# Patient Record
Sex: Female | Born: 1960 | Race: Black or African American | Hispanic: No | Smoking: Former smoker
Health system: Southern US, Community
[De-identification: ages and names within clinical notes are randomized; demographics above are authoritative.]

## PROBLEM LIST (undated history)

## (undated) DIAGNOSIS — S134XXA Sprain of ligaments of cervical spine, initial encounter: Secondary | ICD-10-CM

## (undated) DIAGNOSIS — Z972 Presence of dental prosthetic device (complete) (partial): Secondary | ICD-10-CM

## (undated) DIAGNOSIS — E669 Obesity, unspecified: Secondary | ICD-10-CM

## (undated) HISTORY — PX: FOOT SURGERY: SHX648

## (undated) HISTORY — DX: Sprain of ligaments of cervical spine, initial encounter: S13.4XXA

## (undated) HISTORY — DX: Obesity, unspecified: E66.9

---

## 2008-12-06 ENCOUNTER — Emergency Department: Payer: Self-pay | Admitting: Emergency Medicine

## 2009-12-28 ENCOUNTER — Ambulatory Visit: Payer: Self-pay | Admitting: Family Medicine

## 2010-01-04 ENCOUNTER — Emergency Department: Payer: Self-pay | Admitting: Emergency Medicine

## 2011-03-22 ENCOUNTER — Ambulatory Visit: Payer: Self-pay | Admitting: General Practice

## 2011-03-30 ENCOUNTER — Encounter: Payer: Self-pay | Admitting: Physician Assistant

## 2011-04-12 ENCOUNTER — Encounter: Payer: Self-pay | Admitting: Physician Assistant

## 2013-09-09 LAB — HM PAP SMEAR

## 2013-09-12 ENCOUNTER — Ambulatory Visit: Payer: Self-pay | Admitting: Family Medicine

## 2013-12-09 ENCOUNTER — Ambulatory Visit: Payer: Self-pay | Admitting: Family Medicine

## 2014-09-03 ENCOUNTER — Ambulatory Visit: Payer: Self-pay | Admitting: Physician Assistant

## 2014-09-15 ENCOUNTER — Ambulatory Visit: Payer: BC Managed Care – PPO | Admitting: Podiatry

## 2014-09-26 ENCOUNTER — Encounter: Payer: Self-pay | Admitting: Podiatry

## 2014-09-26 ENCOUNTER — Other Ambulatory Visit: Payer: Self-pay | Admitting: *Deleted

## 2014-09-26 ENCOUNTER — Ambulatory Visit (INDEPENDENT_AMBULATORY_CARE_PROVIDER_SITE_OTHER): Payer: BC Managed Care – PPO

## 2014-09-26 ENCOUNTER — Ambulatory Visit (INDEPENDENT_AMBULATORY_CARE_PROVIDER_SITE_OTHER): Payer: BC Managed Care – PPO | Admitting: Podiatry

## 2014-09-26 VITALS — BP 125/86 | HR 75 | Resp 16 | Ht 62.0 in | Wt 232.6 lb

## 2014-09-26 DIAGNOSIS — M722 Plantar fascial fibromatosis: Secondary | ICD-10-CM

## 2014-09-26 MED ORDER — TRIAMCINOLONE ACETONIDE 10 MG/ML IJ SUSP
10.0000 mg | Freq: Once | INTRAMUSCULAR | Status: AC
  Administered 2014-09-26: 10 mg

## 2014-09-26 MED ORDER — DICLOFENAC SODIUM 75 MG PO TBEC: 75.0000 mg | DELAYED_RELEASE_TABLET | Freq: Two times a day (BID) | ORAL | Status: DC

## 2014-09-26 NOTE — Progress Notes (Signed)
Subjective:     Patient ID: Candice Campbell, female   DOB: 1961-05-25, 53 y.o.   MRN: [id number]  Foot Pain   patient presents stating I'm having a lot of heel pain for the last month and it seems to be getting worse and it's making it hard for me to do any form of walking   Review of Systems  All other systems reviewed and are negative.      Objective:   Physical Exam  Nursing note and vitals reviewed. Constitutional: She is oriented to person, place, and time.  Cardiovascular: Intact distal pulses.   Musculoskeletal: Normal range of motion.  Neurological: She is oriented to person, place, and time.  Skin: Skin is warm.   neurovascular status intact with muscle strength adequate and range of motion of the subtalar and midtarsal joint within normal limits. Patient is noted to have good digital perfusion is well oriented x3 with no equinus condition. Patient has extreme pain in the left plantar fashion at the insertion of the tendon into the calcaneus with no pain within the calcaneus itself     Assessment:     Plantar fasciitis of the left heel at the insertion    Plan:     H&P and x-rays reviewed with patient. Injected the left plantar fascia 3 mg Kenalog 5 mg Xylocaine and applied fascial strapping with instructions on usage and brace and diclofenac 75 mg twice a day. Instructed on physical therapy and reappoint in 10 days to recheck

## 2014-09-26 NOTE — Progress Notes (Signed)
   Subjective:    Patient ID: Candice Campbell, female    DOB: 1961-01-14, 53 y.o.   MRN: [id number]  HPI Comments: LEFT PLANTAR HEEL PAIN THAT HAS BEEN GOING ON FOR ABOUT A MONTH NOW, IT THROBS AND IT IS SHARP. SOMETIMES I CAN HARDLY WALK IN THE MORNINGS.   Foot Pain      Review of Systems  All other systems reviewed and are negative.      Objective:   Physical Exam        Assessment & Plan:

## 2014-09-26 NOTE — Patient Instructions (Signed)

## 2014-10-07 ENCOUNTER — Ambulatory Visit: Payer: BC Managed Care – PPO | Admitting: Podiatry

## 2014-10-17 ENCOUNTER — Ambulatory Visit: Payer: BC Managed Care – PPO | Admitting: Podiatry

## 2014-11-14 ENCOUNTER — Ambulatory Visit: Payer: BC Managed Care – PPO | Admitting: Podiatry

## 2014-12-19 ENCOUNTER — Ambulatory Visit (INDEPENDENT_AMBULATORY_CARE_PROVIDER_SITE_OTHER): Payer: BC Managed Care – PPO | Admitting: Podiatry

## 2014-12-19 DIAGNOSIS — M722 Plantar fascial fibromatosis: Secondary | ICD-10-CM

## 2014-12-21 NOTE — Progress Notes (Signed)
Subjective:     Patient ID: Candice Campbell, female   DOB: 03-02-1961, 54 y.o.   MRN: [id number]  HPI patient states I been getting severe pain in the plantar aspect of my left heel and it did not respond to the medication that we used several months ago and I have just been trying to gut it out. States that it's getting worse and it's making it possible for her to do activities and it's been present for about 8-10 months now   Review of Systems     Objective:   Physical Exam Neurovascular status intact with muscle strength adequate and range of motion subtalar midtarsal joint within normal limits. Severe discomfort plantar aspect left heel medial band and also mild discomfort in the lateral side of the heel and into the Achilles tendon where she is walking different. There is quite a bit of swelling present with the pain    Assessment:     Plantar fasciitis left with inflammation and fluid buildup around the medial band    Plan:     Reviewed condition and recommended physical therapy anti-inflammatories and long-term because the pain so severe correction. Patient wants to get this fixed and I explained to the patient endoscopic surgery and the advantages and risk associated with it. She wants surgery and I allowed her to read a consent form for correction going over all possible complications as outlined in the consent form and she is [facility] in the next several weeks. I dispensed air fracture walker with instructions on usage and encouraged her to call with any questions

## 2014-12-24 DIAGNOSIS — M722 Plantar fascial fibromatosis: Secondary | ICD-10-CM

## 2014-12-26 ENCOUNTER — Telehealth: Payer: Self-pay | Admitting: Podiatry

## 2014-12-26 NOTE — Telephone Encounter (Signed)
PT IS WANTING TO SEE ABOUT [facility] PAPERWORK HAS BEEN SENT OFF TO [facility]- SHE STATES ONE OF THE DISABILITY FORMS HAS ALREADY BEEN SENT BUT SHE IS CHECKING ON THE [facility]. PLEASE CALL PATIENT WITH INFO ON Monday PLEASE

## 2014-12-30 DIAGNOSIS — M722 Plantar fascial fibromatosis: Secondary | ICD-10-CM

## 2014-12-31 ENCOUNTER — Ambulatory Visit (INDEPENDENT_AMBULATORY_CARE_PROVIDER_SITE_OTHER): Payer: BC Managed Care – PPO | Admitting: Podiatry

## 2014-12-31 VITALS — BP 149/97 | HR 89 | Temp 98.0°F | Resp 16

## 2014-12-31 DIAGNOSIS — Z9889 Other specified postprocedural states: Secondary | ICD-10-CM

## 2014-12-31 MED ORDER — ONDANSETRON HCL 4 MG PO TABS: 4.0000 mg | ORAL_TABLET | Freq: Three times a day (TID) | ORAL | Status: DC | PRN

## 2014-12-31 NOTE — Progress Notes (Signed)
She presents today 1 week status post endoscopic plantar fasciotomy of the left foot. She denies fever chills nausea vomiting muscle aches and pains. She states that she is tired of the boot. She states that she has been wearing all of the time.  Objective: Cam Walker and dry sterile dressing is intact left foot. Once removed demonstrates no edema no erythema no cellulitis no drainage no odor. Sutures are intact and margins appear to be well coapted mild tenderness on palpation of the plantar fascial calcaneal insertion site.  Assessment: Well-healed surgical endoscopic plantar fasciotomy left foot 1 week.  Plan: I placed her in a compression anklet today and apply Band-Aids to the surgical sites. I encouraged massage therapy to the plantar aspect of the left heel to help break down any scar tissue that will be forming over the next few weeks. She will follow-up with Dr. Charlsie Merlesegal in 1 week. She was instructed not to get this foot wet until she sees Dr. Charlsie Merlesegal.

## 2015-01-06 ENCOUNTER — Ambulatory Visit (INDEPENDENT_AMBULATORY_CARE_PROVIDER_SITE_OTHER): Payer: BC Managed Care – PPO | Admitting: Podiatry

## 2015-01-06 VITALS — BP 169/106 | HR 85 | Resp 16

## 2015-01-06 DIAGNOSIS — M722 Plantar fascial fibromatosis: Secondary | ICD-10-CM

## 2015-01-06 NOTE — Progress Notes (Signed)
Subjective:     Patient ID: Candice Campbell, female   DOB: 05-22-1961, 54 y.o.   MRN: [id number]  HPI patient presents stating she is doing well but she still has pain in the morning and after periods of sitting and she has trouble wearing the big boot to sleep and and she gets some pain if she does walk all day   Review of Systems     Objective:   Physical Exam Neurovascular status intact negative Homan sign was noted with wound sites on the medial and lateral heel that are doing well with the edges well coapted and stitches intact. Minimal plantar pain noted    Assessment:     Doing well post foot surgery left heel    Plan:     Advised on physical therapy and dispensed night splint to wear at night to keep the foot stretched and surgical shoe to begin slowly wearing as a option to the boot. Stitches removed and sterile dressings applied with wound edges well coapted and reappoint again in 4-6 weeks or earlier if any issues should occur

## 2015-01-13 ENCOUNTER — Encounter: Payer: Self-pay | Admitting: Podiatry

## 2015-01-15 ENCOUNTER — Encounter: Payer: Self-pay | Admitting: Podiatry

## 2015-02-20 ENCOUNTER — Ambulatory Visit (INDEPENDENT_AMBULATORY_CARE_PROVIDER_SITE_OTHER): Payer: BC Managed Care – PPO | Admitting: Podiatry

## 2015-02-20 ENCOUNTER — Encounter: Payer: Self-pay | Admitting: Podiatry

## 2015-02-20 VITALS — BP 157/92 | HR 77

## 2015-02-20 DIAGNOSIS — M722 Plantar fascial fibromatosis: Secondary | ICD-10-CM

## 2015-02-20 MED ORDER — TRIAMCINOLONE ACETONIDE 10 MG/ML IJ SUSP
10.0000 mg | Freq: Once | INTRAMUSCULAR | Status: AC
  Administered 2015-02-20: 10 mg

## 2015-02-20 NOTE — Progress Notes (Signed)
Subjective:     Patient ID: Candice Campbell, female   DOB: [birth year], 54 y.o.   MRN: [id number]  HPI patient states I'm still having pain in the bottom my left heel but it seems to be in a different spot than wear was previously   Review of Systems     Objective:   Physical Exam Neurovascular status intact with incision sites on the medial and lateral side of the left heel that are doing well and wound edges are coapted well. Pain is more on the proximal portion of the fascia and more central and lateral    Assessment:     Patient does have continued edema and may be developing stress on the remaining band of the plantar fascia with the medial band doing very well with no pain when palpated    Plan:     Injected from the lateral side 3 mg Kenalog 5 mg Xylocaine advised on physical therapy and reappoint to recheck again in several weeks

## 2015-03-20 ENCOUNTER — Encounter: Payer: Self-pay | Admitting: Podiatry

## 2015-03-20 ENCOUNTER — Ambulatory Visit (INDEPENDENT_AMBULATORY_CARE_PROVIDER_SITE_OTHER): Payer: BC Managed Care – PPO | Admitting: Podiatry

## 2015-03-20 VITALS — BP 145/82 | HR 66 | Resp 16

## 2015-03-20 DIAGNOSIS — Z9889 Other specified postprocedural states: Secondary | ICD-10-CM | POA: Diagnosis not present

## 2015-03-20 DIAGNOSIS — M779 Enthesopathy, unspecified: Secondary | ICD-10-CM

## 2015-03-20 DIAGNOSIS — M722 Plantar fascial fibromatosis: Secondary | ICD-10-CM | POA: Diagnosis not present

## 2015-03-20 MED ORDER — TRAMADOL HCL 50 MG PO TABS: 50.0000 mg | ORAL_TABLET | Freq: Three times a day (TID) | ORAL | Status: DC

## 2015-03-22 NOTE — Progress Notes (Signed)
Subjective:     Patient ID: Candice Campbell, female   DOB: 07-Aug-1961, 54 y.o.   MRN: [id number]  HPI patient states I'm still getting pain in my foot after surgery of 10 weeks ago. States that the plantar heel is doing well but there's pain in the mid arch lateral side of the foot when she's active   Review of Systems     Objective:   Physical Exam Neurovascular status intact with no changes in health history and no discomfort on the medial fascial band were released the tendon. I noted some discomfort on the lateral fascial band and more into the arch and lateral foot probable secondary to compensation. No mottled skin appearance no clonus to the foot and no indications of other systemic condition    Assessment:     Recovery still occurring from plantar fascial release left with lateral foot pain and midfoot pain    Plan:     I reviewed with her that this can occur after endoscopic surgery and it is still relatively early during the recovery process. She will increase her boot and night splint usage along with ice and we dispensed another fascial brace for support and also I placed her on tramadol 3 times a day. I will see her back in 5 weeks and if symptoms are not improving look at other testing but at this time it is still relatively early in the process and I'm very hopeful that this will get better on its own

## 2015-04-24 ENCOUNTER — Ambulatory Visit (INDEPENDENT_AMBULATORY_CARE_PROVIDER_SITE_OTHER): Payer: BC Managed Care – PPO | Admitting: Podiatry

## 2015-04-24 VITALS — BP 152/94 | HR 76 | Resp 16

## 2015-04-24 DIAGNOSIS — M722 Plantar fascial fibromatosis: Secondary | ICD-10-CM

## 2015-04-26 NOTE — Progress Notes (Signed)
Subjective:     Patient ID: Candice Campbell, female   DOB: 1961-03-20, 54 y.o.   MRN: [id number]  HPI patient states it hurts if him on on it too long but it's much better than it was prior to surgery   Review of Systems     Objective:   Physical Exam Neurovascular status intact wound edges well coapted with minimal discomfort plantar heel left and mild discomfort in the arch left foot    Assessment:     Doing well post endoscopic surgery left    Plan:     H&P and condition reviewed and at this point I recommended supportive shoe gear usage anti-inflammatories and gradual increase in activities along with splint usage at night. I do believe symptoms will completely go away but may take several more months

## 2015-07-15 ENCOUNTER — Ambulatory Visit: Payer: Self-pay | Admitting: Family Medicine

## 2015-10-16 ENCOUNTER — Telehealth: Payer: Self-pay | Admitting: Family Medicine

## 2015-10-16 ENCOUNTER — Other Ambulatory Visit: Payer: Self-pay | Admitting: Family Medicine

## 2015-10-16 NOTE — Telephone Encounter (Signed)
Pt needs refills on lisinopril 10 mg and hydrochlorothiazide 25 mg sent to [facility] on [address]  Her call back number is [phone]

## 2015-10-16 NOTE — Telephone Encounter (Signed)
Explained to patient she must be seen and maybe she can get #30, but she will need to keep appt. Patient has appt scheduled for 10/20/15.

## 2015-10-16 NOTE — Telephone Encounter (Signed)
May send in refills for #30 of Lisinopril 10 mg., and HCTZ, 25 mg to her pharmacy

## 2015-10-20 ENCOUNTER — Ambulatory Visit (INDEPENDENT_AMBULATORY_CARE_PROVIDER_SITE_OTHER): Payer: BC Managed Care – PPO | Admitting: Family Medicine

## 2015-10-20 ENCOUNTER — Encounter: Payer: Self-pay | Admitting: Family Medicine

## 2015-10-20 VITALS — BP 125/80 | HR 86 | Temp 97.5°F | Resp 16 | Ht 65.0 in | Wt 228.0 lb

## 2015-10-20 DIAGNOSIS — I10 Essential (primary) hypertension: Secondary | ICD-10-CM | POA: Insufficient documentation

## 2015-10-20 DIAGNOSIS — E669 Obesity, unspecified: Secondary | ICD-10-CM | POA: Diagnosis not present

## 2015-10-20 HISTORY — DX: Obesity, unspecified: E66.9

## 2015-10-20 MED ORDER — LOSARTAN POTASSIUM 25 MG PO TABS: 25.0000 mg | ORAL_TABLET | Freq: Every day | ORAL | Status: DC

## 2015-10-20 MED ORDER — CLONIDINE HCL 0.1 MG PO TABS: 0.1000 mg | ORAL_TABLET | Freq: Two times a day (BID) | ORAL | Status: DC

## 2015-10-20 MED ORDER — HYDROCHLOROTHIAZIDE 25 MG PO TABS: 25.0000 mg | ORAL_TABLET | Freq: Every day | ORAL | Status: DC

## 2015-10-20 NOTE — Progress Notes (Signed)
Name: Candice Campbell   MRN: [id number]    DOB: 1961-03-23   Date:10/20/2015       Progress Note  Subjective  Chief Complaint  Chief Complaint  Patient presents with  . Hypertension    HPI  Here for f/u of HBP.  Taking her meds.  Feeling pretty well overall, but still gaining weight. No problem-specific assessment & plan notes found for this encounter.   Past Medical History  Diagnosis Date  . Hypertension     Social History  Substance Use Topics  . Smoking status: Current Some Day Smoker -- 0.50 packs/day for 10 years    Types: Cigarettes  . Smokeless tobacco: Never Used  . Alcohol Use: 0.0 oz/week    0 Standard drinks or equivalent per week     Comment: occasional     Current outpatient prescriptions:  .  cloNIDine (CATAPRES) 0.1 MG tablet, , Disp: , Rfl:  .  hydrochlorothiazide (HYDRODIURIL) 25 MG tablet, TAKE ONE TABLET BY MOUTH ONCE DAILY, Disp: 30 tablet, Rfl: 0 .  losartan (COZAAR) 25 MG tablet, Take 25 mg by mouth daily., Disp: , Rfl:  .  diclofenac (VOLTAREN) 75 MG EC tablet, Take 1 tablet (75 mg total) by mouth 2 (two) times daily. (Patient not taking: Reported on 10/20/2015), Disp: 60 tablet, Rfl: 1  No Known Allergies  Review of Systems  Constitutional: Negative for fever, chills, weight loss and malaise/fatigue.  HENT: Negative for hearing loss.   Eyes: Negative for blurred vision and double vision.  Respiratory: Negative for cough, sputum production, shortness of breath and wheezing.   Cardiovascular: Negative for chest pain, palpitations and leg swelling.  Gastrointestinal: Negative for heartburn, abdominal pain and blood in stool.  Genitourinary: Negative for dysuria, urgency and frequency.  Musculoskeletal: Negative for myalgias and joint pain.  Skin: Negative for rash.  Neurological: Negative for dizziness, tremors, weakness and headaches.      Objective  Filed Vitals:   10/20/15 0830  BP: 129/86  Pulse: 86  Temp: 97.5 F (36.4 C)  TempSrc:  Oral  Resp: 16  Height:  (1.651 m)  Weight: 228 lb (103.42 kg)     Physical Exam  Constitutional: She is oriented to person, place, and time and well-developed, well-nourished, and in no distress. No distress.  HENT:  Head: Normocephalic and atraumatic.  Eyes: Conjunctivae are normal. Pupils are equal, round, and reactive to light. No scleral icterus.  Neck: Normal range of motion. Neck supple. Carotid bruit is not present. No thyromegaly present.  Cardiovascular: Normal rate, regular rhythm, normal heart sounds and intact distal pulses.  Exam reveals no gallop and no friction rub.   No murmur heard. Pulmonary/Chest: Effort normal and breath sounds normal. No respiratory distress. She has no wheezes. She has no rales.  Abdominal: Soft. Bowel sounds are normal. She exhibits no distension, no abdominal bruit and no mass. There is no tenderness.  Musculoskeletal: Normal range of motion. She exhibits no edema.  Lymphadenopathy:    She has no cervical adenopathy.  Neurological: She is alert and oriented to person, place, and time.  Vitals reviewed.     No results found for this or any previous visit (from the past 2160 hour(s)).   Assessment & Plan  1. Essential hypertension  - Comprehensive Metabolic Panel (CMET) - CBC with Differential - losartan (COZAAR) 25 MG tablet; Take 1 tablet (25 mg total) by mouth daily.  Dispense: 30 tablet; Refill: 12 - hydrochlorothiazide (HYDRODIURIL) 25 MG tablet; Take 1 tablet (  25 mg total) by mouth daily.  Dispense: 30 tablet; Refill: 12 - cloNIDine (CATAPRES) 0.1 MG tablet; Take 1 tablet (0.1 mg total) by mouth 2 (two) times daily.  Dispense: 60 tablet; Refill: 12  2. Obesity  - Lipid Profile - TSH

## 2015-10-20 NOTE — Patient Instructions (Signed)
Discussed reducing calories to reduce weight.  Discussed stopping smoking again.

## 2015-10-27 ENCOUNTER — Telehealth: Payer: Self-pay | Admitting: Family Medicine

## 2015-10-27 NOTE — Telephone Encounter (Signed)
Will be printed from [email or web address]

## 2015-10-27 NOTE — Telephone Encounter (Signed)
Pt needs a copy of her shot record.  Her call back number is [phone].

## 2015-10-28 ENCOUNTER — Ambulatory Visit: Payer: BC Managed Care – PPO

## 2015-11-10 ENCOUNTER — Ambulatory Visit (INDEPENDENT_AMBULATORY_CARE_PROVIDER_SITE_OTHER): Payer: BC Managed Care – PPO | Admitting: *Deleted

## 2015-11-10 DIAGNOSIS — Z23 Encounter for immunization: Secondary | ICD-10-CM

## 2015-11-10 NOTE — Progress Notes (Signed)
Patient was given TDAP and Hep B #1 today. Patient tolerated well.

## 2016-02-01 ENCOUNTER — Ambulatory Visit: Payer: Self-pay | Admitting: Family Medicine

## 2016-02-14 IMAGING — CR DG HEEL 2V*L*
1 series · 2 of 2 positions shown · non-contrast
Comparison: None.

CLINICAL DATA: Heel pain when standing or walking.

EXAM:
DG HEEL 2V LEFT

[Series 1: lat · 0.17mm/px · 2 of 2 slices shown]
[im 1/2]
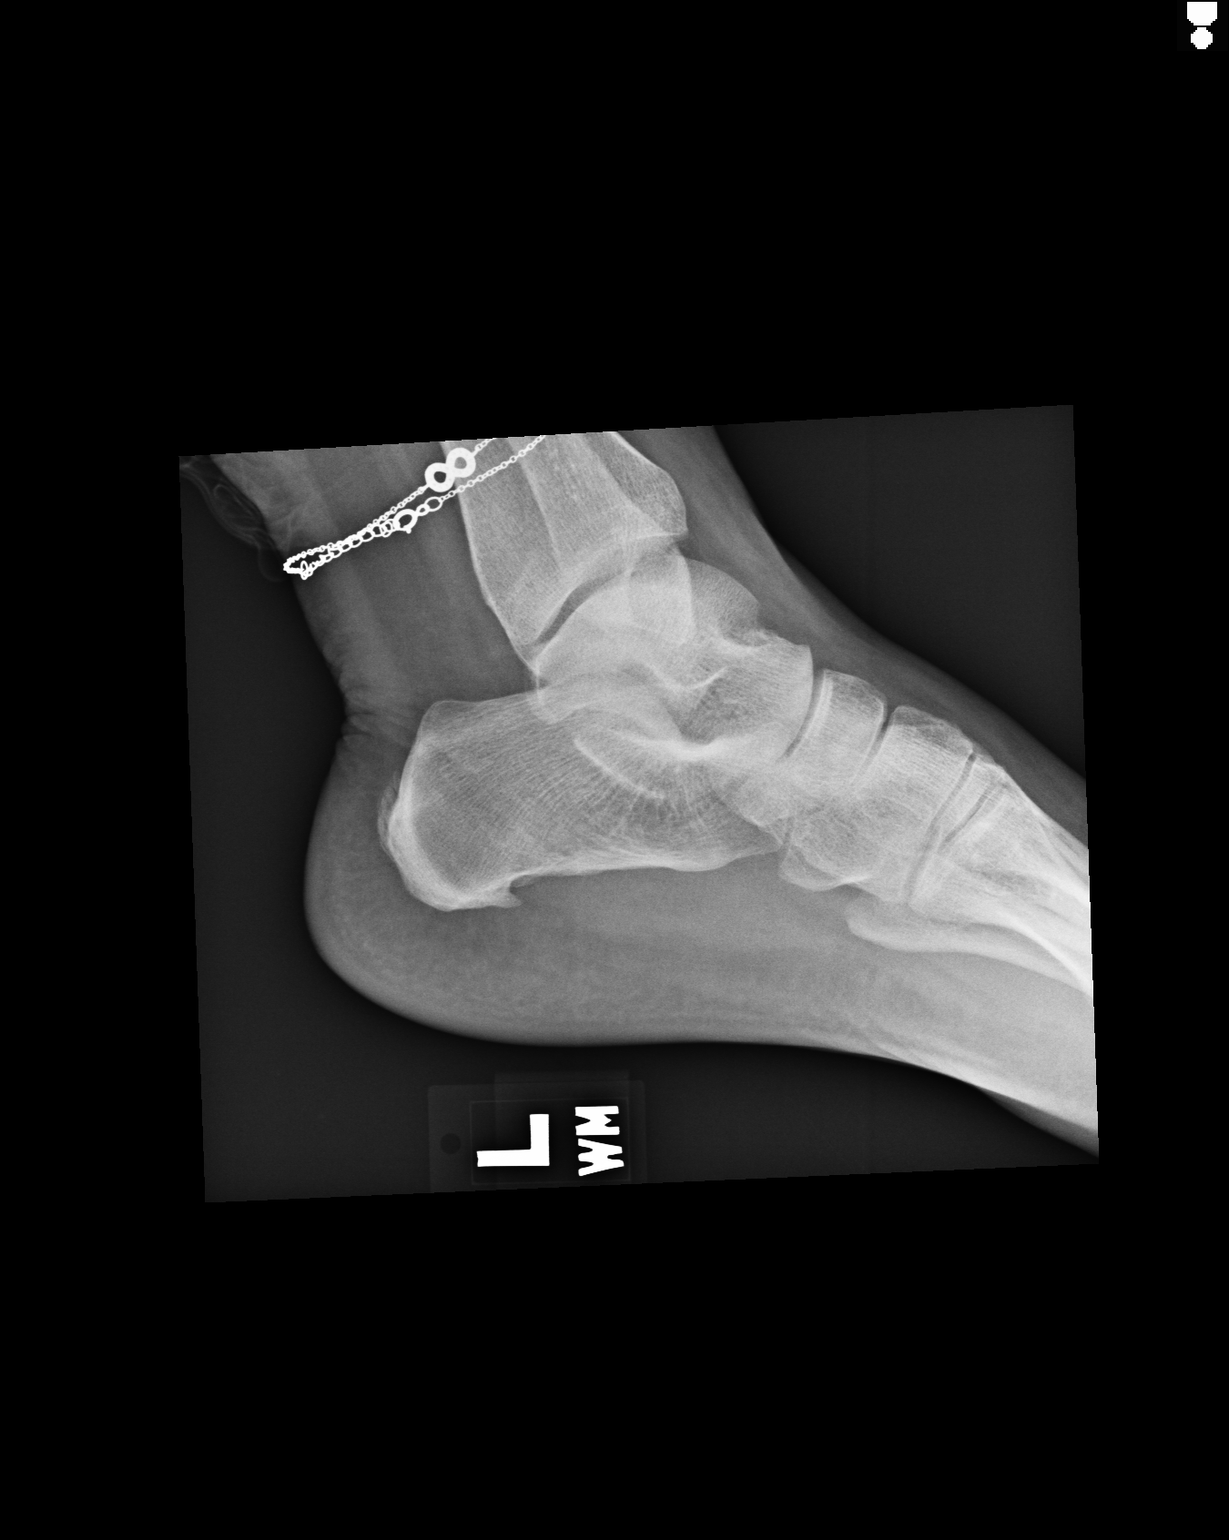
[im 2/2]
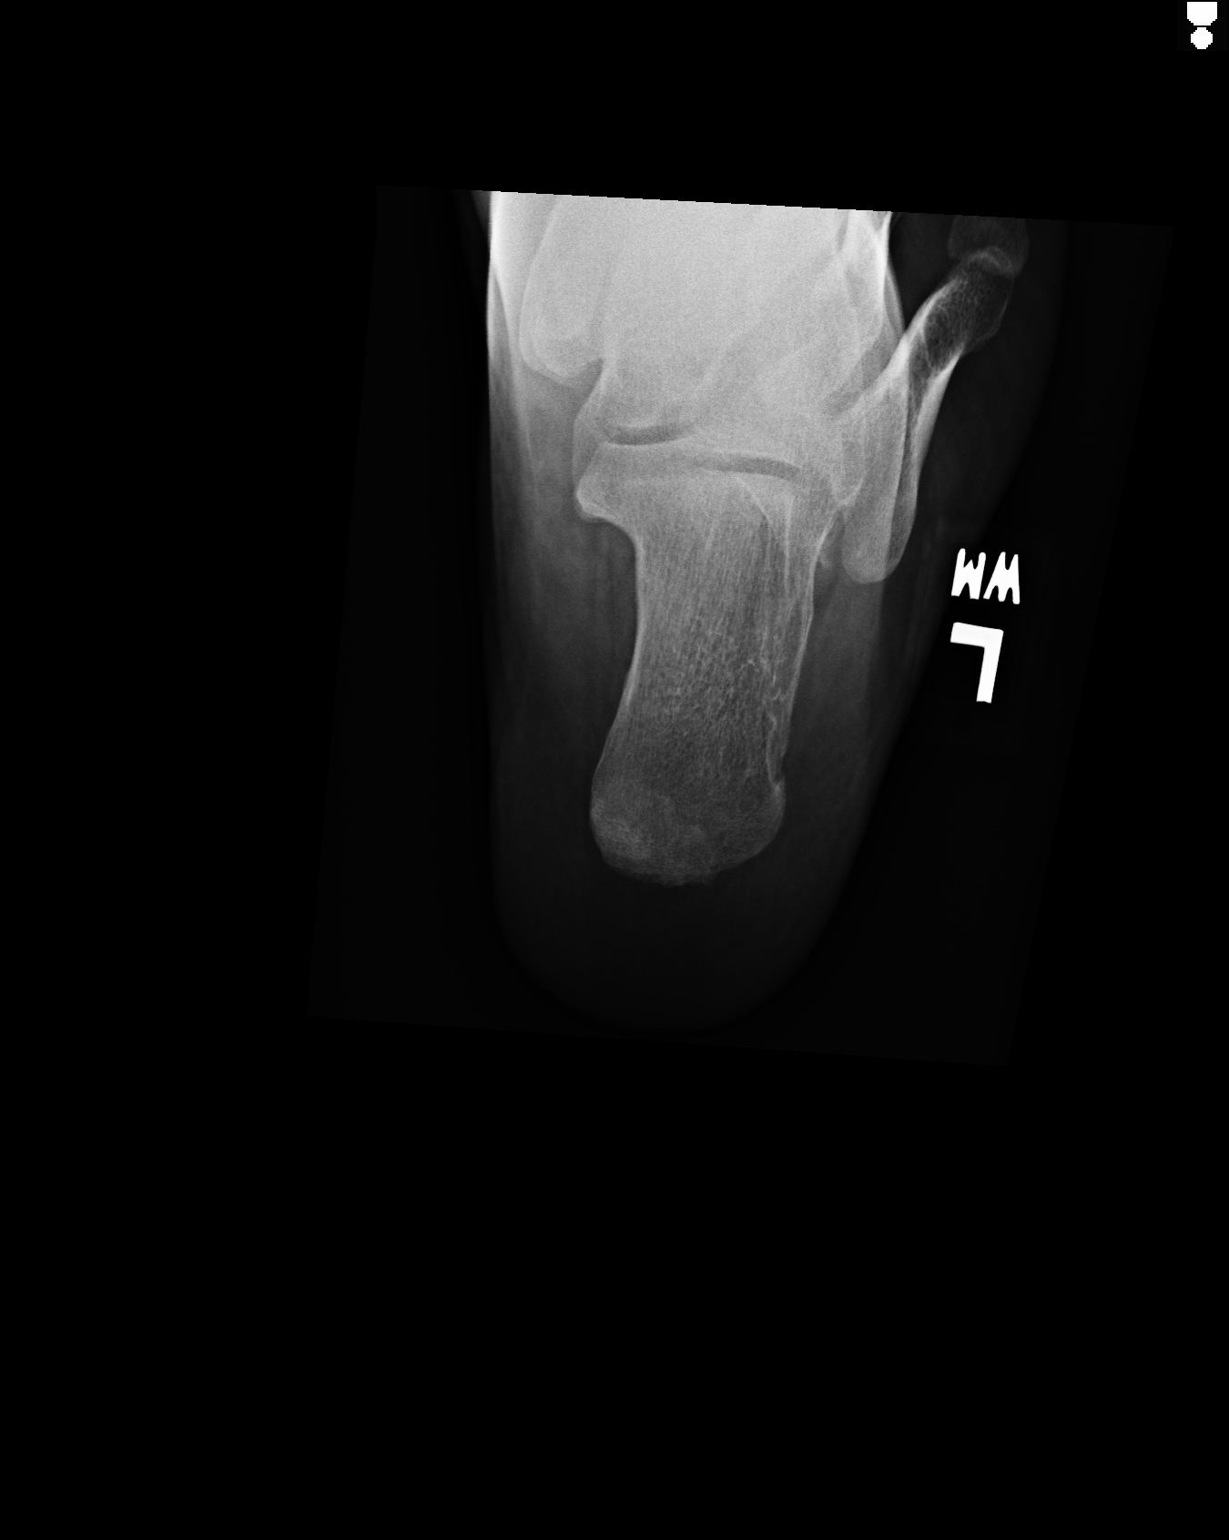

[2 of 2 positions shown; findings below may reference images not displayed]

FINDINGS: There is a small plantar calcaneal spur. There is slight
calcification at the Achilles insertion on the posterior calcaneus.
Otherwise normal.
IMPRESSION: Calcaneal spurs.

## 2016-03-09 ENCOUNTER — Ambulatory Visit (INDEPENDENT_AMBULATORY_CARE_PROVIDER_SITE_OTHER): Payer: BC Managed Care – PPO | Admitting: Family Medicine

## 2016-03-09 ENCOUNTER — Ambulatory Visit
Admission: RE | Admit: 2016-03-09 | Discharge: 2016-03-09 | Disposition: A | Discharge status: Home / Self Care | Payer: BC Managed Care – PPO | Source: Ambulatory Visit | Attending: Family Medicine | Admitting: Family Medicine

## 2016-03-09 ENCOUNTER — Encounter: Payer: Self-pay | Admitting: Family Medicine

## 2016-03-09 VITALS — BP 148/97 | HR 94 | Temp 100.4°F | Resp 16 | Ht 65.0 in | Wt 223.6 lb

## 2016-03-09 DIAGNOSIS — J101 Influenza due to other identified influenza virus with other respiratory manifestations: Secondary | ICD-10-CM | POA: Insufficient documentation

## 2016-03-09 DIAGNOSIS — I10 Essential (primary) hypertension: Secondary | ICD-10-CM

## 2016-03-09 DIAGNOSIS — J40 Bronchitis, not specified as acute or chronic: Secondary | ICD-10-CM | POA: Insufficient documentation

## 2016-03-09 LAB — POCT INFLUENZA A/B
Influenza A, POC: POSITIVE — AB
Influenza B, POC: NEGATIVE

## 2016-03-09 MED ORDER — BENZONATATE 100 MG PO CAPS: 100.0000 mg | ORAL_CAPSULE | Freq: Three times a day (TID) | ORAL | Status: DC | PRN

## 2016-03-09 MED ORDER — ALBUTEROL SULFATE HFA 108 (90 BASE) MCG/ACT IN AERS: 2.0000 | INHALATION_SPRAY | Freq: Four times a day (QID) | RESPIRATORY_TRACT | Status: DC | PRN

## 2016-03-09 MED ORDER — OSELTAMIVIR PHOSPHATE 75 MG PO CAPS: 75.0000 mg | ORAL_CAPSULE | Freq: Two times a day (BID) | ORAL | Status: DC

## 2016-03-09 MED ORDER — DM-GUAIFENESIN ER 30-600 MG PO TB12: 1.0000 | ORAL_TABLET | Freq: Two times a day (BID) | ORAL | Status: DC

## 2016-03-09 NOTE — Patient Instructions (Signed)

## 2016-03-09 NOTE — Assessment & Plan Note (Signed)
Pt cautioned to avoid OTC decongestant with HTN.

## 2016-03-09 NOTE — Progress Notes (Signed)
Subjective:    Patient ID: Candice Campbell, female    DOB: February 01, 1961, 55 y.o.   MRN: [id number]  HPI: Candice Campbell is a 55 y.o. female presenting on 03/09/2016 for Cough   HPI  Pt presents for cough and fever. Symptoms began on Friday with dry cough. Fever started on yesterday. T-max 100.4 orally. Dry cough continued through weekend. Cough worsened Sunday- previously coughing up yellow sputum. No chest tightness or shortness of breath. Some wheezing. Runny nose. HA and body aches began on Sunday. Home treatment: alka-seltazer cold and flu. Mild relief.  Some nausea.   Past Medical History  Diagnosis Date  . Hypertension     Current Outpatient Prescriptions on File Prior to Visit  Medication Sig  . cloNIDine (CATAPRES) 0.1 MG tablet Take 1 tablet (0.1 mg total) by mouth 2 (two) times daily.  . diclofenac (VOLTAREN) 75 MG EC tablet Take 1 tablet (75 mg total) by mouth 2 (two) times daily.  . hydrochlorothiazide (HYDRODIURIL) 25 MG tablet Take 1 tablet (25 mg total) by mouth daily.  Marland Kitchen losartan (COZAAR) 25 MG tablet Take 1 tablet (25 mg total) by mouth daily.   No current facility-administered medications on file prior to visit.    Review of Systems  Constitutional: Positive for fever, chills and fatigue.  HENT: Positive for congestion and rhinorrhea. Negative for ear pain and sore throat.   Eyes: Negative for visual disturbance.  Respiratory: Positive for cough and wheezing. Negative for chest tightness.   Cardiovascular: Negative for chest pain, palpitations and leg swelling.  Gastrointestinal: Positive for nausea. Negative for vomiting and diarrhea.  Musculoskeletal: Positive for myalgias. Negative for neck pain and neck stiffness.  Skin: Negative for color change and rash.  Neurological: Positive for headaches. Negative for syncope and weakness.   Per HPI unless specifically indicated above     Objective:    BP 148/97 mmHg  Pulse 94  Temp(Src) 100.4 F (38 C) (Oral)   Resp 16  Ht  (1.651 m)  Wt 223 lb 9.6 oz (101.424 kg)  BMI 37.21 kg/m2  SpO2 99%  Wt Readings from Last 3 Encounters:  03/09/16 223 lb 9.6 oz (101.424 kg)  10/20/15 228 lb (103.42 kg)  09/26/14 232 lb 9.6 oz (105.507 kg)    Physical Exam  Constitutional: She is oriented to person, place, and time. She appears well-developed and well-nourished. She appears ill. No distress.  HENT:  Head: Normocephalic and atraumatic.  Right Ear: Hearing and tympanic membrane normal.  Left Ear: Hearing and tympanic membrane normal.  Nose: Mucosal edema and rhinorrhea present. Right sinus exhibits no maxillary sinus tenderness and no frontal sinus tenderness. Left sinus exhibits no maxillary sinus tenderness and no frontal sinus tenderness.  Mouth/Throat: Uvula is midline, oropharynx is clear and moist and mucous membranes are normal. No posterior oropharyngeal erythema.  Neck: Normal range of motion. No erythema present. No Brudzinski's sign and no Kernig's sign noted.  Cardiovascular: Normal rate and regular rhythm.  Exam reveals no gallop and no friction rub.   No murmur heard. Pulmonary/Chest: Effort normal. No respiratory distress. She has no decreased breath sounds. She has wheezes (expiratory- with coughing.) in the right lower field and the left lower field. She has no rhonchi. She has no rales. Chest wall is not dull to percussion. She exhibits no tenderness.  Bronchial breath sounds.  Lymphadenopathy:    She has cervical adenopathy.       Right cervical: Superficial cervical adenopathy present.  Left cervical: Superficial cervical adenopathy present.  Neurological: She is alert and oriented to person, place, and time.  Skin: Skin is warm and dry. No rash noted. She is not diaphoretic. No erythema. No pallor.  Psychiatric: She has a normal mood and affect. Her speech is normal and behavior is normal.   Results for orders placed or performed in visit on 03/09/16  POCT Influenza A/B    Result Value Ref Range   Influenza A, POC Positive (A) Negative   Influenza B, POC Negative Negative      Assessment & Plan:   Problem List Items Addressed This Visit      Cardiovascular and Mediastinum   HBP (high blood pressure)    Pt cautioned to avoid OTC decongestant with HTN.        Other Visit Diagnoses    Influenza A    -  Primary    + Flu A. Questionable efficacy of tamiflu but patient would like to try. Supportive care at home for cough and Alarm symptoms and return precautions reviewed.     Relevant Medications    benzonatate (TESSALON) 100 MG capsule    dextromethorphan-guaiFENesin (MUCINEX DM) 30-600 MG 12hr tablet    oseltamivir (TAMIFLU) 75 MG capsule    Other Relevant Orders    POCT Influenza A/B (Completed)    DG Chest 2 View    Bronchitis        Likely 2/2 flu. Mild wheezing- no steroids needed. CXR to r/o pneumonia- plan to treat for atypicals if +pneumonia. Albuterol PRN. Respiratory distress symptoms reviewed- pt will go to ER if needed. Return precautons reviewed.    Relevant Medications    albuterol (PROVENTIL HFA;VENTOLIN HFA) 108 (90 Base) MCG/ACT inhaler    benzonatate (TESSALON) 100 MG capsule    Other Relevant Orders    DG Chest 2 View       Meds ordered this encounter  Medications  . albuterol (PROVENTIL HFA;VENTOLIN HFA) 108 (90 Base) MCG/ACT inhaler    Sig: Inhale 2 puffs into the lungs every 6 (six) hours as needed for wheezing or shortness of breath.    Dispense:  1 Inhaler    Refill:  0    Order Specific Question:  Supervising Provider    Answer:  Janeann ForehandHAWKINS JR, JAMES H [[id number]]  . benzonatate (TESSALON) 100 MG capsule    Sig: Take 1 capsule (100 mg total) by mouth 3 (three) times daily as needed.    Dispense:  30 capsule    Refill:  0    Order Specific Question:  Supervising Provider    Answer:  Janeann ForehandHAWKINS JR, JAMES H [[id number]]  . dextromethorphan-guaiFENesin (MUCINEX DM) 30-600 MG 12hr tablet    Sig: Take 1 tablet by mouth 2 (two) times  daily.    Dispense:  20 tablet    Refill:  0    Order Specific Question:  Supervising Provider    Answer:  Janeann ForehandHAWKINS JR, JAMES H [id number]]  . oseltamivir (TAMIFLU) 75 MG capsule    Sig: Take 1 capsule (75 mg total) by mouth 2 (two) times daily.    Dispense:  10 capsule    Refill:  0    Order Specific Question:  Supervising Provider    Answer:  Janeann ForehandHAWKINS JR, JAMES H [[id number]]      Follow up plan: Return if symptoms worsen or fail to improve.

## 2016-12-08 ENCOUNTER — Other Ambulatory Visit: Payer: Self-pay | Admitting: Family Medicine

## 2016-12-08 DIAGNOSIS — I10 Essential (primary) hypertension: Secondary | ICD-10-CM

## 2016-12-13 ENCOUNTER — Ambulatory Visit: Payer: BC Managed Care – PPO | Admitting: Family Medicine

## 2016-12-19 ENCOUNTER — Ambulatory Visit: Payer: BC Managed Care – PPO | Admitting: Family Medicine

## 2017-01-06 ENCOUNTER — Emergency Department: Payer: BC Managed Care – PPO

## 2017-01-06 ENCOUNTER — Emergency Department
Admission: EM | Admit: 2017-01-06 | Discharge: 2017-01-06 | Disposition: A | Discharge status: Home / Self Care | Payer: BC Managed Care – PPO | Attending: Student in an Organized Health Care Education/Training Program | Admitting: Student in an Organized Health Care Education/Training Program

## 2017-01-06 DIAGNOSIS — Y92410 Unspecified street and highway as the place of occurrence of the external cause: Secondary | ICD-10-CM | POA: Diagnosis not present

## 2017-01-06 DIAGNOSIS — F17210 Nicotine dependence, cigarettes, uncomplicated: Secondary | ICD-10-CM | POA: Insufficient documentation

## 2017-01-06 DIAGNOSIS — Y999 Unspecified external cause status: Secondary | ICD-10-CM | POA: Diagnosis not present

## 2017-01-06 DIAGNOSIS — Z79899 Other long term (current) drug therapy: Secondary | ICD-10-CM | POA: Insufficient documentation

## 2017-01-06 DIAGNOSIS — M7918 Myalgia, other site: Secondary | ICD-10-CM

## 2017-01-06 DIAGNOSIS — S161XXA Strain of muscle, fascia and tendon at neck level, initial encounter: Secondary | ICD-10-CM

## 2017-01-06 DIAGNOSIS — Y9389 Activity, other specified: Secondary | ICD-10-CM | POA: Insufficient documentation

## 2017-01-06 DIAGNOSIS — I10 Essential (primary) hypertension: Secondary | ICD-10-CM | POA: Diagnosis not present

## 2017-01-06 DIAGNOSIS — S199XXA Unspecified injury of neck, initial encounter: Secondary | ICD-10-CM | POA: Diagnosis present

## 2017-01-06 MED ORDER — TRAMADOL HCL 50 MG PO TABS: 50.0000 mg | ORAL_TABLET | Freq: Four times a day (QID) | ORAL | 0 refills | Status: DC | PRN

## 2017-01-06 MED ORDER — METHOCARBAMOL 750 MG PO TABS: 750.0000 mg | ORAL_TABLET | Freq: Four times a day (QID) | ORAL | 0 refills | Status: DC

## 2017-01-06 MED ORDER — IBUPROFEN 600 MG PO TABS: 600.0000 mg | ORAL_TABLET | Freq: Three times a day (TID) | ORAL | 0 refills | Status: DC | PRN
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
## 2017-01-09 ENCOUNTER — Encounter: Payer: Self-pay | Admitting: Family Medicine

## 2017-01-09 ENCOUNTER — Ambulatory Visit (INDEPENDENT_AMBULATORY_CARE_PROVIDER_SITE_OTHER): Payer: BC Managed Care – PPO | Admitting: Family Medicine

## 2017-01-09 VITALS — BP 126/74 | HR 83 | Temp 98.2°F | Resp 16 | Ht 65.0 in | Wt 220.0 lb

## 2017-01-09 DIAGNOSIS — S46912A Strain of unspecified muscle, fascia and tendon at shoulder and upper arm level, left arm, initial encounter: Secondary | ICD-10-CM

## 2017-01-09 DIAGNOSIS — S134XXA Sprain of ligaments of cervical spine, initial encounter: Secondary | ICD-10-CM

## 2017-01-09 HISTORY — DX: Sprain of ligaments of cervical spine, initial encounter: S13.4XXA

## 2017-01-09 MED ORDER — BACLOFEN 10 MG PO TABS: 5.0000 mg | ORAL_TABLET | Freq: Three times a day (TID) | ORAL | 0 refills | Status: DC | PRN
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
## 2017-01-20 ENCOUNTER — Ambulatory Visit: Payer: BC Managed Care – PPO | Admitting: Family Medicine

## 2017-01-27 ENCOUNTER — Ambulatory Visit (INDEPENDENT_AMBULATORY_CARE_PROVIDER_SITE_OTHER): Payer: BC Managed Care – PPO | Admitting: Family Medicine

## 2017-01-27 ENCOUNTER — Encounter: Payer: Self-pay | Admitting: Family Medicine

## 2017-01-27 ENCOUNTER — Ambulatory Visit: Payer: BC Managed Care – PPO | Admitting: Family Medicine

## 2017-01-27 VITALS — BP 158/94 | HR 78 | Temp 97.9°F | Resp 16 | Ht 65.0 in | Wt 223.0 lb

## 2017-01-27 DIAGNOSIS — S46912D Strain of unspecified muscle, fascia and tendon at shoulder and upper arm level, left arm, subsequent encounter: Secondary | ICD-10-CM | POA: Diagnosis not present

## 2017-01-27 DIAGNOSIS — I10 Essential (primary) hypertension: Secondary | ICD-10-CM

## 2017-01-27 DIAGNOSIS — S134XXD Sprain of ligaments of cervical spine, subsequent encounter: Secondary | ICD-10-CM | POA: Diagnosis not present

## 2017-01-27 MED ORDER — GABAPENTIN 100 MG PO CAPS: ORAL_CAPSULE | ORAL | 1 refills | Status: DC

## 2017-01-27 NOTE — Assessment & Plan Note (Signed)
Elevated BP today with acute pain and did not take any of her anti-HTN meds, manual re-check still elevated - Advised take meds at home today - Follow-up as needed, treat pain
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
## 2017-01-30 ENCOUNTER — Ambulatory Visit: Payer: BC Managed Care – PPO | Admitting: Family Medicine

## 2017-02-13 ENCOUNTER — Ambulatory Visit: Payer: BC Managed Care – PPO | Admitting: Family Medicine

## 2017-02-16 ENCOUNTER — Encounter: Payer: Self-pay | Admitting: Family Medicine

## 2017-02-16 ENCOUNTER — Ambulatory Visit (INDEPENDENT_AMBULATORY_CARE_PROVIDER_SITE_OTHER): Payer: BC Managed Care – PPO | Admitting: Family Medicine

## 2017-02-16 VITALS — BP 148/90 | HR 67 | Temp 98.7°F | Resp 16 | Ht 65.0 in | Wt 223.0 lb

## 2017-02-16 DIAGNOSIS — S46912D Strain of unspecified muscle, fascia and tendon at shoulder and upper arm level, left arm, subsequent encounter: Secondary | ICD-10-CM | POA: Diagnosis not present

## 2017-02-16 DIAGNOSIS — G8929 Other chronic pain: Secondary | ICD-10-CM

## 2017-02-16 DIAGNOSIS — M542 Cervicalgia: Secondary | ICD-10-CM | POA: Diagnosis not present

## 2017-02-16 DIAGNOSIS — S134XXD Sprain of ligaments of cervical spine, subsequent encounter: Secondary | ICD-10-CM

## 2017-02-16 DIAGNOSIS — S46912A Strain of unspecified muscle, fascia and tendon at shoulder and upper arm level, left arm, initial encounter: Secondary | ICD-10-CM | POA: Insufficient documentation
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
## 2017-03-16 ENCOUNTER — Encounter: Payer: BC Managed Care – PPO | Admitting: Family Medicine

## 2017-03-27 ENCOUNTER — Telehealth: Payer: Self-pay | Admitting: Family Medicine

## 2017-03-27 ENCOUNTER — Encounter: Payer: Self-pay | Admitting: Family Medicine

## 2017-03-27 NOTE — Telephone Encounter (Signed)
Letter written stating patient was referred to Physical therapy. Will need to be faxed to her insurance company or she may pick up copy.  Saralyn [name], DO [facility] 03/27/2017, 11:42 AM
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
## 2017-04-17 ENCOUNTER — Encounter: Payer: Self-pay | Admitting: Family Medicine

## 2017-04-17 ENCOUNTER — Ambulatory Visit (INDEPENDENT_AMBULATORY_CARE_PROVIDER_SITE_OTHER): Payer: BC Managed Care – PPO | Admitting: Family Medicine

## 2017-04-17 VITALS — BP 138/90 | HR 55 | Temp 99.0°F | Resp 16 | Ht 65.0 in | Wt 217.8 lb

## 2017-04-17 DIAGNOSIS — Z1211 Encounter for screening for malignant neoplasm of colon: Secondary | ICD-10-CM

## 2017-04-17 DIAGNOSIS — S46912A Strain of unspecified muscle, fascia and tendon at shoulder and upper arm level, left arm, initial encounter: Secondary | ICD-10-CM | POA: Diagnosis not present

## 2017-04-17 DIAGNOSIS — Z114 Encounter for screening for human immunodeficiency virus [HIV]: Secondary | ICD-10-CM

## 2017-04-17 DIAGNOSIS — I10 Essential (primary) hypertension: Secondary | ICD-10-CM | POA: Diagnosis not present

## 2017-04-17 DIAGNOSIS — Z1159 Encounter for screening for other viral diseases: Secondary | ICD-10-CM

## 2017-04-17 DIAGNOSIS — Z0000 Encounter for general adult medical examination without abnormal findings: Secondary | ICD-10-CM

## 2017-04-17 DIAGNOSIS — N951 Menopausal and female climacteric states: Secondary | ICD-10-CM | POA: Insufficient documentation

## 2017-04-17 DIAGNOSIS — R635 Abnormal weight gain: Secondary | ICD-10-CM | POA: Diagnosis not present

## 2017-04-17 DIAGNOSIS — Z1231 Encounter for screening mammogram for malignant neoplasm of breast: Secondary | ICD-10-CM

## 2017-04-17 DIAGNOSIS — S134XXA Sprain of ligaments of cervical spine, initial encounter: Secondary | ICD-10-CM

## 2017-04-17 DIAGNOSIS — R5382 Chronic fatigue, unspecified: Secondary | ICD-10-CM | POA: Diagnosis not present

## 2017-04-17 DIAGNOSIS — E669 Obesity, unspecified: Secondary | ICD-10-CM | POA: Diagnosis not present

## 2017-04-17 DIAGNOSIS — Z1239 Encounter for other screening for malignant neoplasm of breast: Secondary | ICD-10-CM

## 2017-04-17 LAB — CBC WITH DIFFERENTIAL/PLATELET
BASOS PCT: 1 %
Basophils Absolute: 84 cells/uL (ref 0–200)
EOS PCT: 2 %
Eosinophils Absolute: 168 cells/uL (ref 15–500)
HEMATOCRIT: 39.1 % (ref 35.0–45.0)
Hemoglobin: 12.3 g/dL (ref 11.7–15.5)
LYMPHS PCT: 35 %
Lymphs Abs: 2940 cells/uL (ref 850–3900)
MCH: 28.1 pg (ref 27.0–33.0)
MCHC: 31.5 g/dL — AB (ref 32.0–36.0)
MCV: 89.5 fL (ref 80.0–100.0)
MONO ABS: 420 {cells}/uL (ref 200–950)
MPV: 10.7 fL (ref 7.5–12.5)
Monocytes Relative: 5 %
NEUTROS PCT: 57 %
Neutro Abs: 4788 cells/uL (ref 1500–7800)
Platelets: 303 10*3/uL (ref 140–400)
RBC: 4.37 MIL/uL (ref 3.80–5.10)
RDW: 14.8 % (ref 11.0–15.0)
WBC: 8.4 10*3/uL (ref 3.8–10.8)

## 2017-04-17 LAB — TSH: TSH: 0.84 m[IU]/L

## 2017-04-17 MED ORDER — LOSARTAN POTASSIUM 50 MG PO TABS: 50.0000 mg | ORAL_TABLET | Freq: Every day | ORAL | 3 refills | Status: DC

## 2017-04-17 MED ORDER — BACLOFEN 10 MG PO TABS: 5.0000 mg | ORAL_TABLET | Freq: Three times a day (TID) | ORAL | 2 refills | Status: DC | PRN

## 2017-04-17 MED ORDER — HYDROCHLOROTHIAZIDE 25 MG PO TABS: 25.0000 mg | ORAL_TABLET | Freq: Every day | ORAL | 0 refills | Status: DC

## 2017-04-17 NOTE — Assessment & Plan Note (Signed)
Likely postmenopausal, intermittent No improvement previously on Clonidine, discontinue now Can start Gabapentin as advised nightly for chronic neck pain, may help reduce hot flashes, in future consider Paxil vs Effexor dosing

## 2017-04-17 NOTE — Assessment & Plan Note (Signed)
Stable with some recent wt loss 6-8 lbs now, overall still has abnormal weight, prior gain - Screening labs with TSH, A1c, lipids and other routine labs today - Follow-up, encourage lifestyle improvements

## 2017-04-17 NOTE — Assessment & Plan Note (Signed)
Persistent problem with elevated BP, still some chronic neck pain, not optimally controlled on regimen, outside BPs still elevated No known complication  Plan: 1. Discontinue Clonidine since not seems to help BP, and not helping hot flashes 2. Increase Losartan from 25 to 50mg  daily, sent new rx 3. Continue HCTZ 25mg  daily 4. Improve regular exercise, low salt diet 5. Follow-up 4-6 weeks HTN, med adjust
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
## 2017-04-17 NOTE — Patient Instructions (Signed)
Thank you for coming to the clinic today.  1.  Fasting blood work today, will notify you with results and then discuss at your next visit  Take Baclofen at bedtime for helping relax muscles and allow you to sleep  You may also take Gabapentin 100mg  nightly to help sleep and help with nerves, this can help reduce hot flashes as well, over next several days to weeks you can increase dose to 200 to 300mg  at night. Let me know if need new rx.  ------  Referral to GI specialist for evaluation for Colonoscopy, in office first, and then you schedule the actual Colonoscopy procedure.  [facility] (GI) [address] [place], [place] [address] Phone: [phone]  Midge Miniumarren Wohl, MD Wyline MoodKiran Anna, MD (accepting new patients)  [facility] [address] [place], [place] [address] Phone: [phone] Call anytime to schedule now that order has been placed.  Please schedule a follow-up appointment with Dr. Althea CharonKaramalegos in 4-6 weeks for Hypertension, lab results  If you have any other questions or concerns, please feel free to call the clinic or send a message through MyChart. You may also schedule an earlier appointment if necessary.  Saralyn PilarAlexander Ahsha Hinsley, DO [facility]

## 2017-04-18 LAB — COMPLETE METABOLIC PANEL WITH GFR
ALBUMIN: 3.7 g/dL (ref 3.6–5.1)
ALK PHOS: 42 U/L (ref 33–130)
ALT: 8 U/L (ref 6–29)
AST: 16 U/L (ref 10–35)
BILIRUBIN TOTAL: 0.3 mg/dL (ref 0.2–1.2)
BUN: 10 mg/dL (ref 7–25)
CO2: 27 mmol/L (ref 20–31)
Calcium: 9.7 mg/dL (ref 8.6–10.4)
Chloride: 102 mmol/L (ref 98–110)
Creat: 0.98 mg/dL (ref 0.50–1.05)
GFR, Est African American: 76 mL/min (ref >=60)
GFR, Est Non African American: 66 mL/min (ref >=60)
GLUCOSE: 93 mg/dL (ref 65–99)
POTASSIUM: 4.2 mmol/L (ref 3.5–5.3)
SODIUM: 138 mmol/L (ref 135–146)
TOTAL PROTEIN: 6.8 g/dL (ref 6.1–8.1)

## 2017-04-18 LAB — LIPID PANEL
CHOL/HDL RATIO: 3.2 ratio (ref <5.0)
Cholesterol: 168 mg/dL (ref <200)
HDL: 52 mg/dL (ref >50)
LDL CALC: 103 mg/dL — AB (ref <100)
Triglycerides: 67 mg/dL (ref <150)
VLDL: 13 mg/dL (ref <30)

## 2017-04-18 LAB — HEMOGLOBIN A1C
HEMOGLOBIN A1C: 5.5 % (ref <5.7)
MEAN PLASMA GLUCOSE: 111 mg/dL

## 2017-04-18 LAB — HEPATITIS C ANTIBODY: HCV Ab: NEGATIVE

## 2017-04-18 LAB — HIV ANTIBODY (ROUTINE TESTING W REFLEX): HIV 1&2 Ab, 4th Generation: NONREACTIVE

## 2017-04-18 NOTE — Progress Notes (Signed)
LM for patient to review labs on mychart.

## 2017-05-01 ENCOUNTER — Ambulatory Visit (INDEPENDENT_AMBULATORY_CARE_PROVIDER_SITE_OTHER): Payer: BC Managed Care – PPO | Admitting: Nurse Practitioner

## 2017-05-01 ENCOUNTER — Encounter: Payer: Self-pay | Admitting: Nurse Practitioner

## 2017-05-01 VITALS — BP 132/82 | HR 59 | Temp 98.3°F | Ht 63.0 in | Wt 215.0 lb

## 2017-05-01 DIAGNOSIS — N3000 Acute cystitis without hematuria: Secondary | ICD-10-CM

## 2017-05-01 DIAGNOSIS — R300 Dysuria: Secondary | ICD-10-CM | POA: Diagnosis not present

## 2017-05-01 LAB — POCT URINALYSIS DIPSTICK
Bilirubin, UA: NEGATIVE
Blood, UA: NEGATIVE
Glucose, UA: NEGATIVE
Ketones, UA: NEGATIVE
Nitrite, UA: NEGATIVE
Protein, UA: NEGATIVE
Spec Grav, UA: 1.015 (ref 1.010–1.025)
Urobilinogen, UA: 0.2 E.U./dL
pH, UA: 5 (ref 5.0–8.0)

## 2017-05-01 MED ORDER — SULFAMETHOXAZOLE-TRIMETHOPRIM 400-80 MG PO TABS
1.0000 | ORAL_TABLET | Freq: Two times a day (BID) | ORAL | 0 refills | Status: AC
Start: 2017-05-01 — End: 2017-05-08

## 2017-05-01 MED ORDER — PHENAZOPYRIDINE HCL 100 MG PO TABS: 100.0000 mg | ORAL_TABLET | Freq: Three times a day (TID) | ORAL | 0 refills | Status: AC | PRN

## 2017-05-01 NOTE — Progress Notes (Signed)
poxc

## 2017-05-01 NOTE — Progress Notes (Signed)
Subjective:    Patient ID: Candice Campbell, female    DOB: 11-03-1961, 56 y.o.   MRN: [id number]  Candice Campbell is a 56 y.o. female presenting on 05/01/2017 for Dysuria (pt states she changed her vaginal soap x 4days )   HPI UTI symptoms - Onset 3-4 days ago with urinary dysuria associated with burning during and after urination lasting for about 1 minute.  This is her only positive symptom. - Denies fever, chills, sweats, urinary urgency, frequency, back pain/flank pain, or pelvic pain. - Contributing factors: She has used a new shower gel and a bath bomb recently and thinks this may be why she has had a uti.  She  Has had no recent sexual activity.  Has not taken any medication for her symptoms.  She does state she has not taken her BP pill or drank much water because she doesn't want to void r/t pain.  Social History  Substance Use Topics  . Smoking status: Former Smoker    Packs/day: 0.50    Years: 10.00    Types: Cigarettes  . Smokeless tobacco: Former NeurosurgeonUser     Comment: pt quit smoking 10/2016  . Alcohol use 0.0 oz/week     Comment: occasional    Review of Systems Per HPI unless specifically indicated above     Objective:    BP 132/82   Pulse (!) 59   Temp 98.3 F (36.8 C) (Oral)   Ht 5\' 3"  (1.6 m)   Wt 215 lb (97.5 kg)   BMI 38.09 kg/m   Wt Readings from Last 3 Encounters:  05/01/17 215 lb (97.5 kg)  04/17/17 217 lb 12.8 oz (98.8 kg)  02/16/17 223 lb (101.2 kg)    Physical Exam  Constitutional: She is oriented to person, place, and time. She appears well-developed and well-nourished. No distress.  HENT:  Head: Normocephalic and atraumatic.  Cardiovascular: Normal rate, regular rhythm and normal heart sounds.   Pulmonary/Chest: Effort normal and breath sounds normal.  Abdominal: Soft. Bowel sounds are normal. She exhibits no distension and no mass. There is no tenderness. There is no rebound and no guarding.  No pelvic or suprapubic tenderness from external  exam.  Neurological: She is alert and oriented to person, place, and time.  Skin: Skin is warm and dry.  Psychiatric: She has a normal mood and affect. Her behavior is normal. Judgment and thought content normal.   Results for orders placed or performed in visit on 05/01/17  POCT Urinalysis Dipstick  Result Value Ref Range   Color, UA yellow    Clarity, UA cloudy    Glucose, UA negative    Bilirubin, UA negative    Ketones, UA negative    Spec Grav, UA 1.015 1.010 - 1.025   Blood, UA negative    pH, UA 5.0 5.0 - 8.0   Protein, UA negative    Urobilinogen, UA 0.2 0.2 or 1.0 E.U./dL   Nitrite, UA negative    Leukocytes, UA Moderate (2+) (A) Negative      Assessment & Plan:   Problem List Items Addressed This Visit    None    Visit Diagnoses    Dysuria    -  Primary Primary acute symptom lasting 3-4 days.  Plan: 1. Urine dipstick shows positive for WBC's.  Micro exam: not done. 2. Follow up with urine culture.   Relevant Orders   POCT Urinalysis Dipstick (Completed) Urine Culture   Acute cystitis without hematuria  Positive urine dipstick.  Plan: 1. Treat bacteriuria with Bactrim 400-80mg  tablet BID x 7 days 2. Follow up positive dipstick with urine culture to evaluate appropriate antibiotic therapy. 3. Treat symptoms with pyridum 100 mg tid prn for 2 days.   Relevant Medications   sulfamethoxazole-trimethoprim (BACTRIM,SEPTRA) 400-80 MG tablet   phenazopyridine (PYRIDIUM) 100 MG tablet      Meds ordered this encounter  Medications  . sulfamethoxazole-trimethoprim (BACTRIM,SEPTRA) 400-80 MG tablet    Sig: Take 1 tablet by mouth 2 (two) times daily.    Dispense:  14 tablet    Refill:  0    Order Specific Question:   Supervising Provider    Answer:   Smitty Cords [[id number]]  . phenazopyridine (PYRIDIUM) 100 MG tablet    Sig: Take 1 tablet (100 mg total) by mouth 3 (three) times daily as needed for pain.    Dispense:  10 tablet    Refill:  0    Order  Specific Question:   Supervising Provider    Answer:   Smitty Cords [[id number]]      Follow up plan: Return 3-5 days if not improving.   [name], DNP, AGPCNP-BC Adult Gerontology Primary Care Nurse Practitioner [facility] 05/01/2017, 11:38 AM

## 2017-05-01 NOTE — Progress Notes (Signed)
I have reviewed this encounter including the documentation in this note and/or discussed this patient with the provider, Lauren Kennedy, AGPCNP-BC. I am certifying that I agree with the content of this note as supervising physician.  Anairis Knick, DO [facility] 05/01/2017, 2:49 PM  

## 2017-05-01 NOTE — Patient Instructions (Addendum)
Lupita LeashDonna, Thank you for coming in to clinic today.  1. You have a urinary tract infection. - Take TMP-SMX 400-80 mg twice per day about every 12 hours for 7 days.  This is your antibiotic that will get rid of the infection. - Take pyridium OR azo (over the counter) for up to 2 days as needed for the burning with urination.   Please schedule a follow-up appointment with Wilhelmina McardleLauren Brighten Buzzelli, AGNP to Return 3-5 days if not improving.   If you have any other questions or concerns, please feel free to call the clinic or send a message through MyChart. You may also schedule an earlier appointment if necessary.  Wilhelmina McardleLauren Zollie Clemence, DNP, AGNP-BC Adult Gerontology Nurse Practitioner [facility]    Urinary Tract Infection, Adult A urinary tract infection (UTI) is an infection of any part of the urinary tract, which includes the kidneys, ureters, bladder, and urethra. These organs make, store, and get rid of urine in the body. UTI can be a bladder infection (cystitis) or kidney infection (pyelonephritis). What are the causes? This infection may be caused by fungi, viruses, or bacteria. Bacteria are the most common cause of UTIs. This condition can also be caused by repeated incomplete emptying of the bladder during urination. What increases the risk? This condition is more likely to develop if:  You ignore your need to urinate or hold urine for long periods of time.  You do not empty your bladder completely during urination.  You wipe back to front after urinating or having a bowel movement, if you are female.  You are uncircumcised, if you are female.  You are constipated.  You have a urinary catheter that stays in place (indwelling).  You have a weak defense (immune) system.  You have a medical condition that affects your bowels, kidneys, or bladder.  You have diabetes.  You take antibiotic medicines frequently or for long periods of time, and the antibiotics no longer work  well against certain types of infections (antibiotic resistance).  You take medicines that irritate your urinary tract.  You are exposed to chemicals that irritate your urinary tract.  You are female. What are the signs or symptoms? Symptoms of this condition include:  Fever.  Frequent urination or passing small amounts of urine frequently.  Needing to urinate urgently.  Pain or burning with urination.  Urine that smells bad or unusual.  Cloudy urine.  Pain in the lower abdomen or back.  Trouble urinating.  Blood in the urine.  Vomiting or being less hungry than normal.  Diarrhea or abdominal pain.  Vaginal discharge, if you are female. How is this diagnosed? This condition is diagnosed with a medical history and physical exam. You will also need to provide a urine sample to test your urine. Other tests may be done, including:  Blood tests.  Sexually transmitted disease (STD) testing. If you have had more than one UTI, a cystoscopy or imaging studies may be done to determine the cause of the infections. How is this treated? Treatment for this condition often includes a combination of two or more of the following:  Antibiotic medicine.  Other medicines to treat less common causes of UTI.  Over-the-counter medicines to treat pain.  Drinking enough water to stay hydrated. Follow these instructions at home:  Take over-the-counter and prescription medicines only as told by your health care provider.  If you were prescribed an antibiotic, take it as told by your health care provider. Do not stop  taking the antibiotic even if you start to feel better.  Avoid alcohol, caffeine, tea, and carbonated beverages. They can irritate your bladder.  Drink enough fluid to keep your urine clear or pale yellow.  Keep all follow-up visits as told by your health care provider. This is important.  Make sure to:  Empty your bladder often and completely. Do not hold urine for  long periods of time.  Empty your bladder before and after sex.  Wipe from front to back after a bowel movement if you are female. Use each tissue one time when you wipe. Contact a health care provider if:  You have back pain.  You have a fever.  You feel nauseous or vomit.  Your symptoms do not get better after 3 days.  Your symptoms go away and then return. Get help right away if:  You have severe back pain or lower abdominal pain.  You are vomiting and cannot keep down any medicines or water. This information is not intended to replace advice given to you by your health care provider. Make sure you discuss any questions you have with your health care provider. Document Released: 09/07/2005 Document Revised: 05/11/2016 Document Reviewed: 10/19/2015 Elsevier Interactive Patient Education  2017 [employer].

## 2017-05-05 ENCOUNTER — Telehealth: Payer: Self-pay

## 2017-05-05 ENCOUNTER — Other Ambulatory Visit: Payer: Self-pay

## 2017-05-05 DIAGNOSIS — Z1211 Encounter for screening for malignant neoplasm of colon: Secondary | ICD-10-CM

## 2017-05-05 NOTE — Telephone Encounter (Signed)
Gastroenterology Pre-Procedure Review  Request Date: 05/22/17 Monday Requesting Physician: Dr. Servando SnareWohl  PATIENT REVIEW QUESTIONS: The patient responded to the following health history questions as indicated:    1. Are you having any GI issues? no 2. Do you have a personal history of Polyps? no 3. Do you have a family history of Colon Cancer or Polyps? no 4. Diabetes Mellitus? no 5. Joint replacements in the past 12 months?no 6. Major health problems in the past 3 months?no 7. Any artificial heart valves, MVP, or defibrillator?no    MEDICATIONS & ALLERGIES:    Patient reports the following regarding taking any anticoagulation/antiplatelet therapy:   Plavix, Coumadin, Eliquis, Xarelto, Lovenox, Pradaxa, Brilinta, or Effient? no Aspirin? no  Patient confirms/reports the following medications:  Current Outpatient Prescriptions  Medication Sig Dispense Refill  . albuterol (PROVENTIL HFA;VENTOLIN HFA) 108 (90 Base) MCG/ACT inhaler Inhale 2 puffs into the lungs every 6 (six) hours as needed for wheezing or shortness of breath. (Patient not taking: Reported on 05/01/2017) 1 Inhaler 0  . baclofen (LIORESAL) 10 MG tablet Take 0.5-1 tablets (5-10 mg total) by mouth 3 (three) times daily as needed for muscle spasms. Start only at night. 30 each 2  . gabapentin (NEURONTIN) 100 MG capsule Start 1 capsule daily, increase by 1 cap every 2-3 days as tolerated up to 3 times a day, or may take 3 at once in evening. 30 capsule 1  . hydrochlorothiazide (HYDRODIURIL) 25 MG tablet Take 1 tablet (25 mg total) by mouth daily. 90 tablet 0  . ibuprofen (ADVIL,MOTRIN) 600 MG tablet Take 1 tablet (600 mg total) by mouth every 8 (eight) hours as needed. 15 tablet 0  . losartan (COZAAR) 50 MG tablet Take 1 tablet (50 mg total) by mouth daily. 90 tablet 3  . sulfamethoxazole-trimethoprim (BACTRIM,SEPTRA) 400-80 MG tablet Take 1 tablet by mouth 2 (two) times daily. 14 tablet 0   No current facility-administered  medications for this visit.     Patient confirms/reports the following allergies:  No Known Allergies  No orders of the defined types were placed in this encounter.   AUTHORIZATION INFORMATION Primary Insurance: 1D#: Group #:  Secondary Insurance: 1D#: Group #:  SCHEDULE INFORMATION: Date: 05/22/17 Time: Location: [facility]

## 2017-05-15 ENCOUNTER — Telehealth: Payer: Self-pay | Admitting: Gastroenterology

## 2017-05-15 NOTE — Telephone Encounter (Signed)
Patient left a voice message that she needs to reschedule her colonoscopy that is on 6/7. Please call

## 2017-05-16 NOTE — Telephone Encounter (Signed)
Pt decided to keep her Colonoscopy for 05/22/17. Pt stated she mixed up the dates when she called.

## 2017-05-23 ENCOUNTER — Encounter: Payer: Self-pay | Admitting: *Deleted

## 2017-05-26 ENCOUNTER — Ambulatory Visit: Payer: BC Managed Care – PPO | Admitting: Family Medicine

## 2017-05-26 ENCOUNTER — Ambulatory Visit (INDEPENDENT_AMBULATORY_CARE_PROVIDER_SITE_OTHER): Payer: BC Managed Care – PPO | Admitting: Family Medicine

## 2017-05-26 ENCOUNTER — Encounter: Payer: Self-pay | Admitting: Family Medicine

## 2017-05-26 VITALS — BP 130/82 | HR 82 | Temp 98.4°F | Resp 16 | Ht 63.0 in | Wt 216.0 lb

## 2017-05-26 DIAGNOSIS — I10 Essential (primary) hypertension: Secondary | ICD-10-CM

## 2017-05-26 DIAGNOSIS — M542 Cervicalgia: Secondary | ICD-10-CM

## 2017-05-26 DIAGNOSIS — G5622 Lesion of ulnar nerve, left upper limb: Secondary | ICD-10-CM | POA: Diagnosis not present

## 2017-05-26 DIAGNOSIS — N951 Menopausal and female climacteric states: Secondary | ICD-10-CM | POA: Diagnosis not present

## 2017-05-26 DIAGNOSIS — G8929 Other chronic pain: Secondary | ICD-10-CM

## 2017-05-26 MED ORDER — PAROXETINE HCL ER 12.5 MG PO TB24: 12.5000 mg | ORAL_TABLET | Freq: Every day | ORAL | 5 refills | Status: DC

## 2017-05-26 NOTE — Discharge Instructions (Signed)
General Anesthesia, Adult, Care After °These instructions provide you with information about caring for yourself after your procedure. Your health care provider may also give you more specific instructions. Your treatment has been planned according to current medical practices, but problems sometimes occur. Call your health care provider if you have any problems or questions after your procedure. °What can I expect after the procedure? °After the procedure, it is common to have: °· Vomiting. °· A sore throat. °· Mental slowness. ° °It is common to feel: °· Nauseous. °· Cold or shivery. °· Sleepy. °· Tired. °· Sore or achy, even in parts of your body where you did not have surgery. ° °Follow these instructions at home: °For at least 24 hours after the procedure: °· Do not: °? Participate in activities where you could fall or become injured. °? Drive. °? Use heavy machinery. °? Drink alcohol. °? Take sleeping pills or medicines that cause drowsiness. °? Make important decisions or sign legal documents. °? Take care of children on your own. °· Rest. °Eating and drinking °· If you vomit, drink water, juice, or soup when you can drink without vomiting. °· Drink enough fluid to keep your urine clear or pale yellow. °· Make sure you have little or no nausea before eating solid foods. °· Follow the diet recommended by your health care provider. °General instructions °· Have a responsible adult stay with you until you are awake and alert. °· Return to your normal activities as told by your health care provider. Ask your health care provider what activities are safe for you. °· Take over-the-counter and prescription medicines only as told by your health care provider. °· If you smoke, do not smoke without supervision. °· Keep all follow-up visits as told by your health care provider. This is important. °Contact a health care provider if: °· You continue to have nausea or vomiting at home, and medicines are not helpful. °· You  cannot drink fluids or start eating again. °· You cannot urinate after 8-12 hours. °· You develop a skin rash. °· You have fever. °· You have increasing redness at the site of your procedure. °Get help right away if: °· You have difficulty breathing. °· You have chest pain. °· You have unexpected bleeding. °· You feel that you are having a life-threatening or urgent problem. °This information is not intended to replace advice given to you by your health care provider. Make sure you discuss any questions you have with your health care provider. °Document Released: 03/06/2001 Document Revised: 05/02/2016 Document Reviewed: 11/12/2015 °Elsevier Interactive Patient Education © 2018 [employer] ° °
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
## 2017-05-26 NOTE — Patient Instructions (Addendum)
Thank you for coming to the clinic today.  1.   For blood pressure - improved now on higher dose Losartan 50mg  - you enough refills for 1 year, no new changes at this time.  2. Labs are good. Only concern is A1c 5.5, we should check this again 6-12 months. Concern for potential Pre-Diabetes in the future.  3. Consider Paxil or Effexor/Venlafaxine  Postmenopausal Hot Flashes  Paxil CR (off-label use): 12.5  I think your Left hand numbness tingling episodes may be due to pinched nerve in elbow or neck. Continue PT for now, and home exercises and gym exercise. Try to avoid resting on Left elbow, and overnight if feel symptoms worse when wake up, can try wrap towel or brace/sleeve around Left elbow to avoid flexion, also can try wrist splint or brace if needed.  FOR NOW - hold gabapentin and try new paxil for hot flashes. But in future - you may continue Gabapentin as needed - usually more effective if take more regularly, can continue 100mg  nightly or try up to max 2-3 times daily.  -------------------- [facility] [address] [place], [place] [address] Phone: [phone] Call anytime to schedule now that order has been placed.    Please schedule a Follow-up Appointment to: Return in about 3 months (around 08/26/2017) for HTN, Hot Flashes, Left Neck/Arm/Nerve.  If you have any other questions or concerns, please feel free to call the clinic or send a message through MyChart. You may also schedule an earlier appointment if necessary.  Additionally, you may be receiving a survey about your experience at our clinic within a few days to 1 week by e-mail or mail. We value your feedback.  Saralyn PilarAlexander Codie Hainer, DO [facility]

## 2017-05-27 NOTE — Assessment & Plan Note (Signed)
Now improved control HTN, off clonidine and on higher dose Losartan. - Home BP readings improved   No known complications    Plan:  1.  Continue current BP regimen - HCTZ 25mg  daily, Losartan 50mg  daily 2. Encourage improved lifestyle - low sodium diet, regular exercise (continue with gym, low impact) 3. Continue monitor BP outside office, bring readings to next visit, if persistently >140/90 or new symptoms notify office sooner 4. Follow-up 3 months HTN
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
## 2017-05-27 NOTE — Assessment & Plan Note (Signed)
Not improved on infrequent gabapentin dosing, or previously clonidine - Chronic problem for years, day/night, and all seasons  Plan: 1. Discussion on other med options - agree to try different class of med, SSRI - with Paxil CR low dose 12.5mg  daily - likely allow 4-6 weeks to take effect, counseled on potential side effects and benefits 2. If at next visit 3 months - still no significant improvement consider Venlafaxine (may also get some pain relief from SNRI)

## 2017-05-27 NOTE — Assessment & Plan Note (Signed)
Improved Cleared to continue PT Resume regular exercise at gym, low impact, avoid re-injury May continue Gabapentin Future consider referral to Neurology if needed

## 2017-05-29 ENCOUNTER — Encounter
Admission: RE | Disposition: A | Discharge status: Home / Self Care | Payer: Self-pay | Source: Ambulatory Visit | Attending: Gastroenterology

## 2017-05-29 ENCOUNTER — Ambulatory Visit
Admission: RE | Admit: 2017-05-29 | Discharge: 2017-05-29 | Disposition: A | Discharge status: Home / Self Care | Payer: BC Managed Care – PPO | Source: Ambulatory Visit | Attending: Gastroenterology | Admitting: Gastroenterology

## 2017-05-29 ENCOUNTER — Ambulatory Visit: Payer: BC Managed Care – PPO | Admitting: Anesthesiology

## 2017-05-29 DIAGNOSIS — I10 Essential (primary) hypertension: Secondary | ICD-10-CM | POA: Insufficient documentation

## 2017-05-29 DIAGNOSIS — Z1211 Encounter for screening for malignant neoplasm of colon: Secondary | ICD-10-CM | POA: Diagnosis not present

## 2017-05-29 DIAGNOSIS — K641 Second degree hemorrhoids: Secondary | ICD-10-CM | POA: Insufficient documentation

## 2017-05-29 DIAGNOSIS — Z87891 Personal history of nicotine dependence: Secondary | ICD-10-CM | POA: Insufficient documentation

## 2017-05-29 DIAGNOSIS — Z79899 Other long term (current) drug therapy: Secondary | ICD-10-CM | POA: Diagnosis not present

## 2017-05-29 HISTORY — DX: Presence of dental prosthetic device (complete) (partial): Z97.2

## 2017-05-29 HISTORY — PX: COLONOSCOPY WITH PROPOFOL: SHX5780

## 2017-05-29 SURGERY — COLONOSCOPY WITH PROPOFOL
Anesthesia: General | Wound class: Contaminated

## 2017-05-29 MED ORDER — LACTATED RINGERS IV SOLN
INTRAVENOUS | Status: DC
  Administered 2017-05-29: 08:00:00 via INTRAVENOUS

## 2017-05-29 MED ORDER — STERILE WATER FOR IRRIGATION IR SOLN
Status: DC | PRN
  Administered 2017-05-29: 08:00:00

## 2017-05-29 MED ORDER — OXYCODONE HCL 5 MG/5ML PO SOLN: 5.0000 mg | Freq: Once | ORAL | Status: DC | PRN

## 2017-05-29 MED ORDER — OXYCODONE HCL 5 MG PO TABS: 5.0000 mg | ORAL_TABLET | Freq: Once | ORAL | Status: DC | PRN

## 2017-05-29 MED ORDER — PROPOFOL 10 MG/ML IV BOLUS
INTRAVENOUS | Status: DC | PRN
  Administered 2017-05-29: 100 mg via INTRAVENOUS
  Administered 2017-05-29 (×3): 50 mg via INTRAVENOUS

## 2017-05-29 MED ORDER — LIDOCAINE HCL (CARDIAC) 20 MG/ML IV SOLN
INTRAVENOUS | Status: DC | PRN
  Administered 2017-05-29: 50 mg via INTRAVENOUS

## 2017-05-29 SURGICAL SUPPLY — 23 items

## 2017-05-29 NOTE — Anesthesia Procedure Notes (Signed)
Performed by: Catheryn Slifer Pre-anesthesia Checklist: Patient identified, Emergency Drugs available, Suction available, Timeout performed and Patient being monitored Patient Re-evaluated:Patient Re-evaluated prior to induction Oxygen Delivery Method: Nasal cannula Placement Confirmation: positive ETCO2       

## 2017-05-29 NOTE — Anesthesia Preprocedure Evaluation (Signed)
Anesthesia Evaluation  Patient identified by MRN, date of birth, ID band Patient awake    Reviewed: Allergy & Precautions, H&P , NPO status , Patient's Chart, lab work & pertinent test results  Airway Mallampati: II  TM Distance: >3 FB Neck ROM: full    Dental  (+) Edentulous Upper, Partial Lower   Pulmonary former smoker,    Pulmonary exam normal        Cardiovascular hypertension, On Medications Normal cardiovascular exam     Neuro/Psych    GI/Hepatic negative GI ROS, Neg liver ROS,   Endo/Other  negative endocrine ROS  Renal/GU negative Renal ROS     Musculoskeletal   Abdominal   Peds  Hematology negative hematology ROS (+)   Anesthesia Other Findings   Reproductive/Obstetrics negative OB ROS                             Anesthesia Physical Anesthesia Plan  ASA: II  Anesthesia Plan: General   Post-op Pain Management:    Induction:   PONV Risk Score and Plan:   Airway Management Planned:   Additional Equipment:   Intra-op Plan:   Post-operative Plan:   Informed Consent: I have reviewed the patients History and Physical, chart, labs and discussed the procedure including the risks, benefits and alternatives for the proposed anesthesia with the patient or authorized representative who has indicated his/her understanding and acceptance.     Plan Discussed with:   Anesthesia Plan Comments:         Anesthesia Quick Evaluation

## 2017-05-29 NOTE — Op Note (Signed)
[facility] Gastroenterology Patient Name: Candice Campbell Procedure Date: 05/29/2017 8:14 AM MRN: [id number] Account #: [id number] Date of Birth: Feb 18, 1961 Admit Type: Outpatient Age: 56 Room: [facility] OR ROOM 01 Gender: Female Note Status: Finalized Procedure:            Colonoscopy Indications:          Screening for colorectal malignant neoplasm Providers:            Midge Minium MD, MD Referring MD:         Smitty Cords (Referring MD) Medicines:            Propofol per Anesthesia Complications:        No immediate complications. Procedure:            Pre-Anesthesia Assessment:                       - Prior to the procedure, a History and Physical was                        performed, and patient medications and allergies were                        reviewed. The patient's tolerance of previous                        anesthesia was also reviewed. The risks and benefits of                        the procedure and the sedation options and risks were                        discussed with the patient. All questions were                        answered, and informed consent was obtained. Prior                        Anticoagulants: The patient has taken no previous                        anticoagulant or antiplatelet agents. ASA Grade                        Assessment: II - A patient with mild systemic disease.                        After reviewing the risks and benefits, the patient was                        deemed in satisfactory condition to undergo the                        procedure.                       After obtaining informed consent, the colonoscope was                        passed under direct vision. Throughout the procedure,  the patient's blood pressure, pulse, and oxygen                        saturations were monitored continuously. The Olympus CF                        H180AL Colonoscope (S#: [id number]) was introduced  through                        the anus and advanced to the the cecum, identified by                        appendiceal orifice and ileocecal valve. The                        colonoscopy was performed without difficulty. The                        patient tolerated the procedure well. The quality of                        the bowel preparation was fair. Findings:      The perianal and digital rectal examinations were normal.      Non-bleeding hemorrhoids were found during retroflexion. The hemorrhoids       were Grade II (internal hemorrhoids that prolapse but reduce       spontaneously). Impression:           - Preparation of the colon was fair.                       - Non-bleeding hemorrhoids.                       - No specimens collected. Recommendation:       - Discharge patient to home.                       - Resume previous diet.                       - Continue present medications.                       - Await pathology results. Procedure Code(s):    --- Professional ---                       [phone], Colonoscopy, flexible; diagnostic, including                        collection of specimen(s) by brushing or washing, when                        performed (separate procedure) Diagnosis Code(s):    --- Professional ---                       Z12.11, Encounter for screening for malignant neoplasm                        of colon CPT copyright 2016 American Medical Association. All rights reserved. The codes documented in this report are preliminary and upon coder  review may  be revised to meet current compliance requirements. Midge Minium MD, MD 05/29/2017 8:33:22 AM This report has been signed electronically. Number of Addenda: 0 Note Initiated On: 05/29/2017 8:14 AM Scope Withdrawal Time: 0 hours 6 minutes 15 seconds  Total Procedure Duration: 0 hours 8 minutes 35 seconds       [facility]

## 2017-05-29 NOTE — Anesthesia Postprocedure Evaluation (Signed)
Anesthesia Post Note  Patient: Candice Campbell  Procedure(s) Performed: Procedure(s) (LRB): COLONOSCOPY WITH PROPOFOL (N/A)  Patient location during evaluation: PACU Anesthesia Type: General Level of consciousness: awake and alert Pain management: pain level controlled Vital Signs Assessment: post-procedure vital signs reviewed and stable Respiratory status: spontaneous breathing Cardiovascular status: blood pressure returned to baseline Postop Assessment: no headache Anesthetic complications: no    Verner Cholunkle, III,  Tekeya Geffert D

## 2017-05-29 NOTE — Transfer of Care (Signed)
Immediate Anesthesia Transfer of Care Note  Patient: Candice Campbell  Procedure(s) Performed: Procedure(s) with comments: COLONOSCOPY WITH PROPOFOL (N/A) - requests early as possible   Patient Location: PACU  Anesthesia Type: General  Level of Consciousness: awake, alert  and patient cooperative  Airway and Oxygen Therapy: Patient Spontanous Breathing and Patient connected to supplemental oxygen  Post-op Assessment: Post-op Vital signs reviewed, Patient's Cardiovascular Status Stable, Respiratory Function Stable, Patent Airway and No signs of Nausea or vomiting  Post-op Vital Signs: Reviewed and stable  Complications: No apparent anesthesia complications

## 2017-05-29 NOTE — H&P (Signed)
   Candice Miniumarren Jarrel Knoke, MD [facility] [address] [place], [place] [address] Phone: [phone] Fax : [phone]  Primary Care Physician:  Candice CordsKaramalegos, Candice J, DO Primary Gastroenterologist:  Dr. Servando Campbell  Pre-Procedure History & Physical: HPI:  Candice Campbell is a [age] y.o. female is here for a screening colonoscopy.   Past Medical History:  Diagnosis Date  . Hypertension   . Wears dentures    full upper, partial lower    Past Surgical History:  Procedure Laterality Date  . FOOT SURGERY Left    bone spur removal    Prior to Admission medications   Medication Sig Start Date End Date Taking? Authorizing Provider  baclofen (LIORESAL) 10 MG tablet Take 0.5-1 tablets (5-10 mg total) by mouth 3 (three) times daily as needed for muscle spasms. Start only at night. 04/17/17  Yes Campbell, Netta NeatAlexander J, DO  gabapentin (NEURONTIN) 100 MG capsule Start 1 capsule daily, increase by 1 cap every 2-3 days as tolerated up to 3 times a day, or may take 3 at once in evening. 01/27/17  Yes Campbell, Netta NeatAlexander J, DO  hydrochlorothiazide (HYDRODIURIL) 25 MG tablet Take 1 tablet (25 mg total) by mouth daily. 04/17/17  Yes Campbell, Netta NeatAlexander J, DO  losartan (COZAAR) 50 MG tablet Take 1 tablet (50 mg total) by mouth daily. 04/17/17  Yes Campbell, Netta NeatAlexander J, DO  PARoxetine (PAXIL-CR) 12.5 MG 24 hr tablet Take 1 tablet (12.5 mg total) by mouth daily. For hot flashes. 05/26/17   Candice CordsKaramalegos, Candice J, DO    Allergies as of 05/05/2017  . (No Known Allergies)    History reviewed. No pertinent family history.  Social History   Social History  . Marital status: Divorced    Spouse name: N/A  . Number of children: N/A  . Years of education: N/A   Occupational History  . Not on file.   Social History Main Topics  . Smoking status: Former Smoker    Packs/day: 0.50    Years: 10.00    Types: Cigarettes    Quit date: 2016  . Smokeless tobacco: Never Used     Comment: pt quit smoking  10/2016  . Alcohol use 0.0 oz/week     Comment: occasional - 6x/yr  . Drug use: No  . Sexual activity: Not on file   Other Topics Concern  . Not on file   Social History Narrative  . No narrative on file    Review of Systems: See HPI, otherwise negative ROS  Physical Exam: BP 129/83   Pulse 65   Temp 98.1 F (36.7 C)   Ht 5\' 3"  (1.6 m)   Wt 212 lb (96.2 kg)   SpO2 98%   BMI 37.55 kg/m  General:   Alert,  pleasant and cooperative in NAD Head:  Normocephalic and atraumatic. Neck:  Supple; no masses or thyromegaly. Lungs:  Clear throughout to auscultation.    Heart:  Regular rate and rhythm. Abdomen:  Soft, nontender and nondistended. Normal bowel sounds, without guarding, and without rebound.   Neurologic:  Alert and  oriented x4;  grossly normal neurologically.  Impression/Plan: Candice Campbell is now here to undergo a screening colonoscopy.  Risks, benefits, and alternatives regarding colonoscopy have been reviewed with the patient.  Questions have been answered.  All parties agreeable.

## 2017-05-30 ENCOUNTER — Encounter: Payer: Self-pay | Admitting: Gastroenterology

## 2017-07-03 ENCOUNTER — Encounter: Payer: Self-pay | Admitting: Family Medicine

## 2017-07-03 ENCOUNTER — Ambulatory Visit (INDEPENDENT_AMBULATORY_CARE_PROVIDER_SITE_OTHER): Payer: BC Managed Care – PPO | Admitting: Family Medicine

## 2017-07-03 VITALS — BP 130/79 | HR 81 | Temp 98.2°F | Resp 16 | Ht 63.0 in | Wt 215.6 lb

## 2017-07-03 DIAGNOSIS — S46912D Strain of unspecified muscle, fascia and tendon at shoulder and upper arm level, left arm, subsequent encounter: Secondary | ICD-10-CM | POA: Diagnosis not present

## 2017-07-03 DIAGNOSIS — S134XXD Sprain of ligaments of cervical spine, subsequent encounter: Secondary | ICD-10-CM | POA: Diagnosis not present

## 2017-07-03 DIAGNOSIS — G5622 Lesion of ulnar nerve, left upper limb: Secondary | ICD-10-CM | POA: Diagnosis not present

## 2017-07-03 MED ORDER — GABAPENTIN 100 MG PO CAPS: ORAL_CAPSULE | ORAL | 2 refills | Status: DC

## 2017-07-03 MED ORDER — BACLOFEN 10 MG PO TABS: 5.0000 mg | ORAL_TABLET | Freq: Three times a day (TID) | ORAL | 5 refills | Status: DC | PRN
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
## 2017-07-03 NOTE — Assessment & Plan Note (Signed)
Underlying etiology for chronic neck/upper back pain and muscle spasms See A&P
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
## 2017-07-03 NOTE — Patient Instructions (Addendum)
Thank you for coming to the clinic today.  1.   Referral to Neurology specialist for further evaluation, likely nerve conduction testing. Call within 2 weeks if not heard back to check status of apt.  [facility] [address] [place], [place] [address] Phone: [phone]  -------------------------------------------- For Gabapentin 100mg  capsules nightly - try to take this everynight, and after few nights, can increase to 2 capsules at once for few days, if feel fine after >3-5 days you may increase once more to 3 pills at once for bedtime  Continue Baclofen as needed.  Please schedule a Follow-up Appointment to: No Follow-up on file.  If you have any other questions or concerns, please feel free to call the clinic or send a message through MyChart. You may also schedule an earlier appointment if necessary.  Additionally, you may be receiving a survey about your experience at our clinic within a few days to 1 week by e-mail or mail. We value your feedback.  Saralyn PilarAlexander Marcille Barman, DO [facility]

## 2017-07-27 ENCOUNTER — Other Ambulatory Visit: Payer: Self-pay | Admitting: Family Medicine

## 2017-07-27 DIAGNOSIS — I10 Essential (primary) hypertension: Secondary | ICD-10-CM

## 2017-08-25 ENCOUNTER — Ambulatory Visit: Payer: BC Managed Care – PPO | Admitting: Family Medicine

## 2017-09-06 ENCOUNTER — Ambulatory Visit: Payer: BC Managed Care – PPO | Admitting: Family Medicine

## 2017-09-18 ENCOUNTER — Ambulatory Visit: Payer: BC Managed Care – PPO | Admitting: Family Medicine

## 2018-03-06 ENCOUNTER — Other Ambulatory Visit: Payer: Self-pay | Admitting: Family Medicine

## 2018-03-06 ENCOUNTER — Other Ambulatory Visit: Payer: Self-pay

## 2018-03-06 ENCOUNTER — Encounter: Payer: Self-pay | Admitting: Family Medicine

## 2018-03-06 ENCOUNTER — Ambulatory Visit: Payer: BC Managed Care – PPO | Admitting: Family Medicine

## 2018-03-06 VITALS — BP 125/84 | HR 60 | Temp 98.2°F | Ht 63.0 in | Wt 218.0 lb

## 2018-03-06 DIAGNOSIS — I10 Essential (primary) hypertension: Secondary | ICD-10-CM | POA: Diagnosis not present

## 2018-03-06 DIAGNOSIS — N951 Menopausal and female climacteric states: Secondary | ICD-10-CM

## 2018-03-06 DIAGNOSIS — R05 Cough: Secondary | ICD-10-CM | POA: Diagnosis not present

## 2018-03-06 DIAGNOSIS — R053 Chronic cough: Secondary | ICD-10-CM

## 2018-03-06 DIAGNOSIS — E669 Obesity, unspecified: Secondary | ICD-10-CM

## 2018-03-06 DIAGNOSIS — Z0000 Encounter for general adult medical examination without abnormal findings: Secondary | ICD-10-CM

## 2018-03-06 DIAGNOSIS — R7309 Other abnormal glucose: Secondary | ICD-10-CM

## 2018-03-06 MED ORDER — BENZONATATE 100 MG PO CAPS: 100.0000 mg | ORAL_CAPSULE | Freq: Three times a day (TID) | ORAL | 0 refills | Status: DC | PRN

## 2018-03-06 MED ORDER — AZITHROMYCIN 250 MG PO TABS: ORAL_TABLET | ORAL | 0 refills | Status: DC

## 2018-03-06 MED ORDER — FLUTICASONE PROPIONATE 50 MCG/ACT NA SUSP: 2.0000 | Freq: Every day | NASAL | 3 refills | Status: DC

## 2018-03-06 MED ORDER — PAROXETINE HCL ER 12.5 MG PO TB24: 12.5000 mg | ORAL_TABLET | Freq: Every day | ORAL | 5 refills | Status: DC

## 2018-03-06 NOTE — Progress Notes (Signed)
Subjective:    Patient ID: Candice Campbell, female    DOB: 1961/09/07, 57 y.o.   MRN: [id number]  Candice Campbell is a 57 y.o. female presenting on 03/06/2018 for Follow-up (medication refill) and Cough (Only at for 2-3 months)   HPI   CHRONIC HTN: Reports concern with BP med losartan, see below. Previous history on Lisinopril in past but developed ACEi cough by history. Current Meds - Losartan 50mg , HCTZ 25mg  daily Reports good compliance, took meds today. Tolerating well, w/o complaints. Denies CP, dyspnea, HA, edema, dizziness / lightheadedness  Chronic Cough and now acute BRONCHITIS, Productive Cough - Reports chronic issue with cough for past 6 months or more, she thinks since she started taking Losartan but is not exactly sure. She had cough before on Lisinopril. Gradual mild worse cough intermittent episodes, sometimes worse at night when laying down feels like she is wheezing, no treatments really helped it before - Reports recent URI symptoms with chest congestion over past 2 weeks, worse with laying down at night having some reported wheezing and cough. Tried Mucinex with some relief temporary still persistent problem. - Several sick contacts with URI cough symptoms - Denies fever chills sweats, nausea vomiting  Admits still has postmenopausal hotflashes, needs refill on Paxil No longer taking Gabapentin for neck, but may try for hot flashes  Depression screen [facility] 2/9 03/06/2018 01/27/2017  Decreased Interest 0 0  Down, Depressed, Hopeless 0 0  PHQ - 2 Score 0 0  Altered sleeping 0 -  Tired, decreased energy 0 -  Change in appetite 0 -  Feeling bad or failure about yourself  0 -  Trouble concentrating 0 -  Moving slowly or fidgety/restless 0 -  Suicidal thoughts 0 -  PHQ-9 Score 0 -  Difficult doing work/chores Not difficult at all -    Social History   Tobacco Use  . Smoking status: Former Smoker    Packs/day: 0.50    Years: 10.00    Pack years: 5.00    Types:  Cigarettes    Last attempt to quit: 2016    Years since quitting: 3.2  . Smokeless tobacco: Never Used  . Tobacco comment: pt quit smoking 10/2016  Substance Use Topics  . Alcohol use: Yes    Alcohol/week: 0.0 oz    Comment: occasional - 6x/yr  . Drug use: No    Review of Systems Per HPI unless specifically indicated above     Objective:    BP 125/84 (BP Location: Right Arm, Patient Position: Sitting, Cuff Size: Normal)   Pulse 60   Temp 98.2 F (36.8 C)   Ht 5\' 3"  (1.6 m)   Wt 218 lb (98.9 kg)   BMI 38.62 kg/m   Wt Readings from Last 3 Encounters:  03/06/18 218 lb (98.9 kg)  07/03/17 215 lb 9.6 oz (97.8 kg)  05/29/17 212 lb (96.2 kg)    Physical Exam  Constitutional: She is oriented to person, place, and time. She appears well-developed and well-nourished. No distress.  Well-appearing, comfortable, cooperative  HENT:  Head: Normocephalic and atraumatic.  Mouth/Throat: Oropharynx is clear and moist.  Eyes: Conjunctivae are normal. Right eye exhibits no discharge. Left eye exhibits no discharge.  Neck: Normal range of motion. Neck supple.  Cardiovascular: Normal rate, regular rhythm, normal heart sounds and intact distal pulses.  No murmur heard. Pulmonary/Chest: Effort normal and breath sounds normal. No respiratory distress. She has no wheezes. She has no rales.  Mild diffuse slightly coarse breath sounds,  clear with cough, non focal, no wheezing some transmitted upper airway sounds.  Musculoskeletal: Normal range of motion. She exhibits no edema.  Lymphadenopathy:    She has no cervical adenopathy.  Neurological: She is alert and oriented to person, place, and time.  Skin: Skin is warm and dry. No rash noted. She is not diaphoretic. No erythema.  Psychiatric: Her behavior is normal.  Well groomed, good eye contact, normal speech and thoughts  Nursing note and vitals reviewed.  Results for orders placed or performed in visit on 05/01/17  POCT Urinalysis Dipstick    Result Value Ref Range   Color, UA yellow    Clarity, UA cloudy    Glucose, UA negative    Bilirubin, UA negative    Ketones, UA negative    Spec Grav, UA 1.015 1.010 - 1.025   Blood, UA negative    pH, UA 5.0 5.0 - 8.0   Protein, UA negative    Urobilinogen, UA 0.2 0.2 or 1.0 E.U./dL   Nitrite, UA negative    Leukocytes, UA Moderate (2+) (A) Negative      Assessment & Plan:   Problem List Items Addressed This Visit    Essential hypertension - Primary    Well-controlled HTN now on current meds - Home BP readings none  No known complications  Works for [employer] has physical in next few months, needs BP controlled Failed ACEi cough. Off Clonidine now.   Plan:  1. Continue current BP regimen - Losartan 50mg  daily, HCTZ 25mg  daily 2. Encourage improved lifestyle - low sodium diet, regular exercise 3. Start monitor BP outside office, bring readings to next visit, if persistently >140/90 or new symptoms notify office sooner 4. Follow-up 6 weeks for physical will review labs, if currently treated cough does not improve, then next step may be to DC ARB and switch to CCB as possibility may have some type of cough still from med      Hot flashes due to menopause    Refilled Paxil, seems to have been helping unsure based on history Follow-up - may add back gabapentin PRN for this      Relevant Medications   PARoxetine (PAXIL-CR) 12.5 MG 24 hr tablet    Other Visit Diagnoses    Chronic cough       Relevant Medications   benzonatate (TESSALON) 100 MG capsule   azithromycin (ZITHROMAX Z-PAK) 250 MG tablet   fluticasone (FLONASE) 50 MCG/ACT nasal spray     Consistent with worsening bronchitis in setting of likely viral URI (+sick contacts) now over past 2 weeks - However chronic cough non productive over several months >6, uncertain exact etiology seems non infectious, may be med side effect ?ARB - Afebrile, no focal signs of infection (not consistent with pneumonia by history or  exam), no evidence sinusitis. Mild coarse breath sounds, never smoker, no history of COPD or asthma   Plan: 1. Given duration - Start Azithromycin Z-pak dosing 500mg  then 250mg  daily x 4 days - Start nasal steroid Flonase 2 sprays in each nostril daily for 4-6 weeks, may repeat course seasonally or as needed - Start [employer] take 1 capsule up to 3 times a day as needed for cough Recommend trial OTC - Mucinex, Tylenol/Ibuprofen PRN, Nasal saline, lozenges, tea with honey/lemon Return criteria reviewed, follow-up within 1 week if not improved  Next may consider CXR, albuterol or meds for bronchospasm if need, otherwise will adjust HTN meds     Meds ordered this encounter  Medications  . PARoxetine (PAXIL-CR) 12.5 MG 24 hr tablet    Sig: Take 1 tablet (12.5 mg total) by mouth daily. For hot flashes.    Dispense:  30 tablet    Refill:  5  . benzonatate (TESSALON) 100 MG capsule    Sig: Take 1 capsule (100 mg total) by mouth 3 (three) times daily as needed for cough.    Dispense:  30 capsule    Refill:  0  . azithromycin (ZITHROMAX Z-PAK) 250 MG tablet    Sig: Take 2 tabs (500mg  total) on Day 1. Take 1 tab (250mg ) daily for next 4 days.    Dispense:  6 tablet    Refill:  0  . fluticasone (FLONASE) 50 MCG/ACT nasal spray    Sig: Place 2 sprays into both nostrils daily. Use for 4-6 weeks then stop and use seasonally or as needed.    Dispense:  16 g    Refill:  3     Follow up plan: Return in about 6 weeks (around 04/17/2018) for Annual Physical (f/u cough).  Future labs ordered for 04/09/18 will be due for Pap Smear as well at future visit if agrees to this screening.  Given handout for [employer] at [employer] [facility] [facility]  Saralyn PilarAlexander Shenequa Howse, DO [facility] 03/06/2018, 1:07 PM

## 2018-03-06 NOTE — Assessment & Plan Note (Addendum)
Refilled Paxil, seems to have been helping unsure based on history Follow-up - may add back gabapentin PRN for this

## 2018-03-06 NOTE — Patient Instructions (Addendum)
Thank you for coming to the office today.  Keep taking current BP pills - no change today, possible that Losartan can be contributing to cough, but this is very rare.  Treat as a deeper bronchitis infection today.  Take antibiotic Azithromycin  Start Tessalon Perls take 1 capsule up to 3 times a day as needed for cough  Start nasal steroid Flonase 2 sprays in each nostril daily for 4-6 weeks, may repeat course seasonally or as needed  DUE for FASTING BLOOD WORK (no food or drink after midnight before the lab appointment, only water or coffee without cream/sugar on the morning of)  SCHEDULE "Lab Only" visit in the morning at the clinic for lab draw in 6 WEEKS   - Make sure Lab Only appointment is at about 1 week before your next appointment, so that results will be available  For Lab Results, once available within 2-3 days of blood draw, you can can log in to MyChart online to view your results and a brief explanation. Also, we can discuss results at next follow-up visit.   Please schedule a Follow-up Appointment to: Return in about 6 weeks (around 04/17/2018) for Annual Physical (f/u cough).  If you have any other questions or concerns, please feel free to call the office or send a message through MyChart. You may also schedule an earlier appointment if necessary.  Additionally, you may be receiving a survey about your experience at our office within a few days to 1 week by e-mail or mail. We value your feedback.  Saralyn PilarAlexander Karamalegos, DO [facility]

## 2018-03-06 NOTE — Assessment & Plan Note (Signed)
Well-controlled HTN now on current meds - Home BP readings none  No known complications  Works for [employer] has physical in next few months, needs BP controlled Failed ACEi cough. Off Clonidine now.   Plan:  1. Continue current BP regimen - Losartan 50mg  daily, HCTZ 25mg  daily 2. Encourage improved lifestyle - low sodium diet, regular exercise 3. Start monitor BP outside office, bring readings to next visit, if persistently >140/90 or new symptoms notify office sooner 4. Follow-up 6 weeks for physical will review labs, if currently treated cough does not improve, then next step may be to DC ARB and switch to CCB as possibility may have some type of cough still from med

## 2018-04-06 ENCOUNTER — Other Ambulatory Visit: Payer: Self-pay

## 2018-04-06 DIAGNOSIS — I10 Essential (primary) hypertension: Secondary | ICD-10-CM

## 2018-04-06 DIAGNOSIS — Z0000 Encounter for general adult medical examination without abnormal findings: Secondary | ICD-10-CM

## 2018-04-06 DIAGNOSIS — R7309 Other abnormal glucose: Secondary | ICD-10-CM

## 2018-04-06 DIAGNOSIS — E669 Obesity, unspecified: Secondary | ICD-10-CM

## 2018-04-09 ENCOUNTER — Other Ambulatory Visit: Payer: BC Managed Care – PPO

## 2018-04-09 LAB — LIPID PANEL
CHOL/HDL RATIO: 3.3 (calc) (ref <5.0)
CHOLESTEROL: 184 mg/dL (ref <200)
HDL: 56 mg/dL (ref >50)
LDL Cholesterol (Calc): 113 mg/dL (calc) — ABNORMAL HIGH
NON-HDL CHOLESTEROL (CALC): 128 mg/dL (ref <130)
Triglycerides: 65 mg/dL (ref <150)

## 2018-04-09 LAB — COMPLETE METABOLIC PANEL WITH GFR
AG Ratio: 1.1 (calc) (ref 1.0–2.5)
ALKALINE PHOSPHATASE (APISO): 49 U/L (ref 33–130)
ALT: 10 U/L (ref 6–29)
AST: 16 U/L (ref 10–35)
Albumin: 4 g/dL (ref 3.6–5.1)
BUN: 18 mg/dL (ref 7–25)
CO2: 29 mmol/L (ref 20–32)
CREATININE: 0.93 mg/dL (ref 0.50–1.05)
Calcium: 9.6 mg/dL (ref 8.6–10.4)
Chloride: 100 mmol/L (ref 98–110)
GFR, EST NON AFRICAN AMERICAN: 69 mL/min/{1.73_m2} (ref >=60)
GFR, Est African American: 80 mL/min/{1.73_m2} (ref >=60)
GLOBULIN: 3.5 g/dL (ref 1.9–3.7)
GLUCOSE: 106 mg/dL — AB (ref 65–99)
Potassium: 3.8 mmol/L (ref 3.5–5.3)
SODIUM: 136 mmol/L (ref 135–146)
Total Bilirubin: 0.5 mg/dL (ref 0.2–1.2)
Total Protein: 7.5 g/dL (ref 6.1–8.1)

## 2018-04-09 LAB — CBC WITH DIFFERENTIAL/PLATELET
BASOS PCT: 1.1 %
Basophils Absolute: 90 cells/uL (ref 0–200)
Eosinophils Absolute: 189 cells/uL (ref 15–500)
Eosinophils Relative: 2.3 %
HCT: 38.9 % (ref 35.0–45.0)
HEMOGLOBIN: 13.1 g/dL (ref 11.7–15.5)
Lymphs Abs: 2772 cells/uL (ref 850–3900)
MCH: 29.1 pg (ref 27.0–33.0)
MCHC: 33.7 g/dL (ref 32.0–36.0)
MCV: 86.4 fL (ref 80.0–100.0)
MONOS PCT: 5.5 %
MPV: 11.5 fL (ref 7.5–12.5)
NEUTROS ABS: 4699 {cells}/uL (ref 1500–7800)
Neutrophils Relative %: 57.3 %
PLATELETS: 318 10*3/uL (ref 140–400)
RBC: 4.5 10*6/uL (ref 3.80–5.10)
RDW: 13.2 % (ref 11.0–15.0)
TOTAL LYMPHOCYTE: 33.8 %
WBC: 8.2 10*3/uL (ref 3.8–10.8)
WBCMIX: 451 {cells}/uL (ref 200–950)

## 2018-04-10 LAB — HEMOGLOBIN A1C
HEMOGLOBIN A1C: 6 %{Hb} — AB (ref <5.7)
Mean Plasma Glucose: 126 (calc)
eAG (mmol/L): 7 (calc)

## 2018-04-12 ENCOUNTER — Encounter: Payer: Self-pay | Admitting: Family Medicine

## 2018-04-12 ENCOUNTER — Ambulatory Visit (INDEPENDENT_AMBULATORY_CARE_PROVIDER_SITE_OTHER): Payer: BC Managed Care – PPO | Admitting: Family Medicine

## 2018-04-12 VITALS — BP 129/78 | HR 70 | Temp 98.2°F | Resp 16 | Ht 63.0 in | Wt 214.8 lb

## 2018-04-12 DIAGNOSIS — I10 Essential (primary) hypertension: Secondary | ICD-10-CM | POA: Diagnosis not present

## 2018-04-12 DIAGNOSIS — R7303 Prediabetes: Secondary | ICD-10-CM | POA: Insufficient documentation

## 2018-04-12 DIAGNOSIS — E669 Obesity, unspecified: Secondary | ICD-10-CM | POA: Diagnosis not present

## 2018-04-12 DIAGNOSIS — Z0000 Encounter for general adult medical examination without abnormal findings: Secondary | ICD-10-CM

## 2018-04-12 DIAGNOSIS — R7309 Other abnormal glucose: Secondary | ICD-10-CM | POA: Diagnosis not present

## 2018-04-12 MED ORDER — VALSARTAN 80 MG PO TABS: 80.0000 mg | ORAL_TABLET | Freq: Every day | ORAL | 3 refills | Status: DC

## 2018-04-12 NOTE — Progress Notes (Addendum)
Subjective:    Patient ID: Candice Campbell, female    DOB: [birth year], 57 y.o.   MRN: [id number]  Candice Campbell is a 57 y.o. female presenting on 04/12/2018 for Annual Exam   HPI   Here for Annual Physical and Lab Review  CHRONIC HTN: Reports concern with BP med losartan and asking about recall wants to switch options Current Meds - Losartan , HCTZ  daily Reports good compliance, took meds today. Tolerating well, w/o complaints.  Elevated A1c Recent labs showed A1c 6.0, previously 5.5, she is concerned about sugar, motivated to keep improving lifestyle. Never on DM Meds No prior PreDM or DM - Father and Sister passed away with diabetes  Obesity BMI >38 Weight down from 223 to 214 lbs by her report, recent scales at our office 218 to 214, still losing weight She is going to gym regularly Mon-Weds-Fri, her regimen is 2 min 4 reps on incline treadmill, sit ups, multiple weight machines, biceps, squats Diet: eats a high protein diet mostly, breakfast and snack, egg beaters some bacon, triple 0 greek yogurt, green vegetables, meat baked, protein shake or bar, dinner is fist sized portion of meat. Drinking gallon of water a day. - Also takes probiotic and apple every day   Health Maintenance: UTD Colonoscopy 2018 UTD routine Hep C and HIV screen UTD TDap Due for Mammogram Due for Pap smear - she declines to have this done by me today. She is requesting female provider, and will re-schedule for pap only with [name], AGPCNP-BC  Depression screen [facility] 2/9 04/12/2018 03/06/2018 01/27/2017  Decreased Interest 0 0 0  Down, Depressed, Hopeless 0 0 0  PHQ - 2 Score 0 0 0  Altered sleeping 0 0 -  Tired, decreased energy 0 0 -  Change in appetite 0 0 -  Feeling bad or failure about yourself  0 0 -  Trouble concentrating 0 0 -  Moving slowly or fidgety/restless 0 0 -  Suicidal thoughts 0 0 -  PHQ-9 Score 0 0 -  Difficult doing work/chores Not difficult at all Not difficult at all  -    Past Medical History:  Diagnosis Date  . Hypertension   . Wears dentures    full upper, partial lower   Past Surgical History:  Procedure Laterality Date  . COLONOSCOPY WITH PROPOFOL N/A 05/29/2017   Procedure: COLONOSCOPY WITH PROPOFOL;  Surgeon: Midge Minium, MD;  Location: [facility];  Service: Endoscopy;  Laterality: N/A;  requests early as possible   . FOOT SURGERY Left    bone spur removal   Social History   Socioeconomic History  . Marital status: Divorced    Spouse name: Not on file  . Number of children: Not on file  . Years of education: Not on file  . Highest education level: Not on file  Occupational History  . Not on file  Social Needs  . Financial resource strain: Not on file  . Food insecurity:    Worry: Not on file    Inability: Not on file  . Transportation needs:    Medical: Not on file    Non-medical: Not on file  Tobacco Use  . Smoking status: Former Smoker    Packs/day: 0.50    Years: 10.00    Pack years: 5.00    Types: Cigarettes    Last attempt to quit: 2016    Years since quitting: 3.3  . Smokeless tobacco: Never Used  . Tobacco comment: pt quit smoking  10/2016  Substance and Sexual Activity  . Alcohol use: Yes    Alcohol/week: 0.0 oz    Comment: occasional - 6x/yr  . Drug use: No  . Sexual activity: Not on file  Lifestyle  . Physical activity:    Days per week: Not on file    Minutes per session: Not on file  . Stress: Not on file  Relationships  . Social connections:    Talks on phone: Not on file    Gets together: Not on file    Attends religious service: Not on file    Active member of club or organization: Not on file    Attends meetings of clubs or organizations: Not on file    Relationship status: Not on file  . Intimate partner violence:    Fear of current or ex partner: Not on file    Emotionally abused: Not on file    Physically abused: Not on file    Forced sexual activity: Not on file  Other Topics  Concern  . Not on file  Social History Narrative  . Not on file   History reviewed. No pertinent family history. Current Outpatient Medications on File Prior to Visit  Medication Sig  . fluticasone (FLONASE) 50 MCG/ACT nasal spray Place 2 sprays into both nostrils daily. Use for 4-6 weeks then stop and use seasonally or as needed.  . gabapentin (NEURONTIN) 100 MG capsule Start 1 capsule daily, increase by 1 cap every 2-3 days as tolerated up to 3 times a day, or may take 3 at once in evening.  . hydrochlorothiazide (HYDRODIURIL) 25 MG tablet TAKE 1 TABLET BY MOUTH ONCE DAILY  . PARoxetine (PAXIL-CR) 12.5 MG 24 hr tablet Take 1 tablet (12.5 mg total) by mouth daily. For hot flashes.   No current facility-administered medications on file prior to visit.     Review of Systems  Constitutional: Negative for activity change, appetite change, chills, diaphoresis, fatigue and fever.  HENT: Negative for congestion and hearing loss.   Eyes: Negative for visual disturbance.  Respiratory: Negative for apnea, cough, choking, chest tightness, shortness of breath and wheezing.   Cardiovascular: Negative for chest pain, palpitations and leg swelling.  Gastrointestinal: Negative for abdominal pain, anal bleeding, blood in stool, constipation, diarrhea, nausea and vomiting.  Endocrine: Negative for cold intolerance.  Genitourinary: Negative for decreased urine volume, difficulty urinating, dysuria, frequency, hematuria, vaginal bleeding, vaginal discharge and vaginal pain.  Musculoskeletal: Negative for arthralgias, back pain and neck pain.  Skin: Negative for rash.  Allergic/Immunologic: Negative for environmental allergies.  Neurological: Negative for dizziness, weakness, light-headedness, numbness and headaches.  Hematological: Negative for adenopathy.  Psychiatric/Behavioral: Negative for behavioral problems, dysphoric mood and sleep disturbance. The patient is not nervous/anxious.    Per HPI  unless specifically indicated above     Objective:    BP 129/78   Pulse 70   Temp 98.2 F (36.8 C) (Oral)   Resp 16   Ht  (1.6 m)   Wt 214 lb 12.8 oz (97.4 kg)   BMI 38.05 kg/m   Wt Readings from Last 3 Encounters:  04/12/18 214 lb 12.8 oz (97.4 kg)  03/06/18 218 lb (98.9 kg)  07/03/17 215 lb 9.6 oz (97.8 kg)    Physical Exam  Constitutional: She is oriented to person, place, and time. She appears well-developed and well-nourished. No distress.  Well-appearing, comfortable, cooperative  HENT:  Head: Normocephalic and atraumatic.  Mouth/Throat: Oropharynx is clear and moist.  Frontal /  maxillary sinuses non-tender. Nares patent without purulence or edema. Bilateral TMs mostly clear with mild soft cerumen, not obstructing without erythema, effusion or bulging. Oropharynx clear without erythema, exudates, edema or asymmetry.  Eyes: Pupils are equal, round, and reactive to light. Conjunctivae and EOM are normal. Right eye exhibits no discharge. Left eye exhibits no discharge.  Neck: Normal range of motion. Neck supple. No thyromegaly present.  Cardiovascular: Normal rate, regular rhythm, normal heart sounds and intact distal pulses.  No murmur heard. Pulmonary/Chest: Effort normal and breath sounds normal. No respiratory distress. She has no wheezes. She has no rales.  Abdominal: Soft. Bowel sounds are normal. She exhibits no distension and no mass. There is no tenderness.  Musculoskeletal: Normal range of motion. She exhibits no edema or tenderness.  Upper / Lower Extremities: - Normal muscle tone, strength bilateral upper extremities 5/5, lower extremities 5/5  Lymphadenopathy:    She has no cervical adenopathy.  Neurological: She is alert and oriented to person, place, and time.  Distal sensation intact to light touch all extremities  Skin: Skin is warm and dry. No rash noted. She is not diaphoretic. No erythema.  Psychiatric: She has a normal mood and affect. Her behavior  is normal.  Well groomed, good eye contact, normal speech and thoughts  Nursing note and vitals reviewed.  Results for orders placed or performed in visit on 04/06/18  Lipid panel  Result Value Ref Range   Cholesterol 184 <200 mg/dL   HDL 56 >16 mg/dL   Triglycerides 65 <109 mg/dL   LDL Cholesterol (Calc) 113 (H) mg/dL (calc)   Total CHOL/HDL Ratio 3.3 <5.0 (calc)   Non-HDL Cholesterol (Calc) 128 <130 mg/dL (calc)  COMPLETE METABOLIC PANEL WITH GFR  Result Value Ref Range   Glucose, Bld 106 (H) 65 - 99 mg/dL   BUN 18 7 - 25 mg/dL   Creat 6.04 5.40 - 9.81 mg/dL   GFR, Est Non African American 69 > OR = 60 mL/min/1.93m2   GFR, Est African American 80 > OR = 60 mL/min/1.63m2   BUN/Creatinine Ratio NOT APPLICABLE 6 - 22 (calc)   Sodium 136 135 - 146 mmol/L   Potassium 3.8 3.5 - 5.3 mmol/L   Chloride 100 98 - 110 mmol/L   CO2 29 20 - 32 mmol/L   Calcium 9.6 8.6 - 10.4 mg/dL   Total Protein 7.5 6.1 - 8.1 g/dL   Albumin 4.0 3.6 - 5.1 g/dL   Globulin 3.5 1.9 - 3.7 g/dL (calc)   AG Ratio 1.1 1.0 - 2.5 (calc)   Total Bilirubin 0.5 0.2 - 1.2 mg/dL   Alkaline phosphatase (APISO) 49 33 - 130 U/L   AST 16 10 - 35 U/L   ALT 10 6 - 29 U/L  CBC with Differential/Platelet  Result Value Ref Range   WBC 8.2 3.8 - 10.8 Thousand/uL   RBC 4.50 3.80 - 5.10 Million/uL   Hemoglobin 13.1 11.7 - 15.5 g/dL   HCT 19.1 47.8 - 29.5 %   MCV 86.4 80.0 - 100.0 fL   MCH 29.1 27.0 - 33.0 pg   MCHC 33.7 32.0 - 36.0 g/dL   RDW 62.1 30.8 - 65.7 %   Platelets 318 140 - 400 Thousand/uL   MPV 11.5 7.5 - 12.5 fL   Neutro Abs 4,699 1,500 - 7,800 cells/uL   Lymphs Abs 2,772 850 - 3,900 cells/uL   WBC mixed population 451 200 - 950 cells/uL   Eosinophils Absolute 189 15 - 500 cells/uL  Basophils Absolute 90 0 - 200 cells/uL   Neutrophils Relative % 57.3 %   Total Lymphocyte 33.8 %   Monocytes Relative 5.5 %   Eosinophils Relative 2.3 %   Basophils Relative 1.1 %  Hemoglobin A1c  Result Value Ref Range    Hgb A1c MFr Bld 6.0 (H) <5.7 % of total Hgb   Mean Plasma Glucose 126 (calc)   eAG (mmol/L) 7.0 (calc)      Assessment & Plan:   Problem List Items Addressed This Visit    Elevated hemoglobin A1c    Elevated A1c now new concern 6.0 from prior 5.5. No prior dx PreDM At risk with fam history DM Concern with obesity, HTN, HLD  Plan:  1. Not on any therapy currently  2. Encourage improved lifestyle - low carb, low sugar diet, reduce portion size, continue improving regular exercise as planned 3. Follow-up 3 months repeat A1c POC      Essential hypertension    Well-controlled HTN, now further improved w/ weight loss and lifestyle change - Home BP readings normal  No known complications    Plan:  1. DISCONTINUE Losartan  - START Valsartan  daily - Continue HCTZ  daily - offered combo pill 80-12.5mg  but will not reduce thiazide quite yet maybe in future 2. Encourage improved lifestyle - keep improving diet and exercise as planned 3. Continue monitor BP outside office, bring readings to next visit, if persistently >140/90 or new symptoms notify office sooner      Relevant Medications   valsartan (DIOVAN) 80 MG tablet   Obesity (BMI 35.0-39.9 without comorbidity)    Improved weight loss still with lifestyle changes diet and exercise Encourage continue current plan       Other Visit Diagnoses    Annual physical exam    -  Primary    Reviewed and updated health maintenance - Return for pap smear w/ female provider [name], AGPCNP-BC Reviewed lab results Encourage continue improving lifestyle diet exercise wt loss    Meds ordered this encounter  Medications  . valsartan (DIOVAN) 80 MG tablet    Sig: Take 1 tablet (80 mg total) by mouth daily.    Dispense:  90 tablet    Refill:  3    Changed from Losartan to Valsartan    Follow up plan: Return in about 3 months (around 07/13/2018) for Elevated A1c check.  Saralyn [name], DO [facility] 04/12/2018, 5:25 PM

## 2018-04-12 NOTE — Assessment & Plan Note (Signed)
Well-controlled HTN, now further improved w/ weight loss and lifestyle change - Home BP readings normal  No known complications    Plan:  1. DISCONTINUE Losartan  - START Valsartan  daily - Continue HCTZ  daily - offered combo pill 80-12.5mg  but will not reduce thiazide quite yet maybe in future 2. Encourage improved lifestyle - keep improving diet and exercise as planned 3. Continue monitor BP outside office, bring readings to next visit, if persistently >140/90 or new symptoms notify office sooner

## 2018-04-12 NOTE — Assessment & Plan Note (Signed)
Improved weight loss still with lifestyle changes diet and exercise Encourage continue current plan

## 2018-04-12 NOTE — Assessment & Plan Note (Signed)
Elevated A1c now new concern 6.0 from prior 5.5. No prior dx PreDM At risk with fam history DM Concern with obesity, HTN, HLD  Plan:  1. Not on any therapy currently  2. Encourage improved lifestyle - low carb, low sugar diet, reduce portion size, continue improving regular exercise as planned 3. Follow-up 3 months repeat A1c POC

## 2018-04-12 NOTE — Patient Instructions (Addendum)
Thank you for coming to the office today.  STOP Losartan  - switch to Valsartan  daily, this is same class of med and should be equivalent to control BP  Let me know if cost is an issue  Continue Hydrochlorothiazide as you are we may reduce this in future  Keep up the great work, A1c 6.0 today, slightly elevated, but I am confident it will improve as you continue with lifestyle changes  Schedule with Lauren for Pap Smear within 3 to 6 months  Please schedule a Follow-up Appointment to: Return in about 3 months (around 07/13/2018) for Elevated A1c check.  If you have any other questions or concerns, please feel free to call the office or send a message through MyChart. You may also schedule an earlier appointment if necessary.  Additionally, you may be receiving a survey about your experience at our office within a few days to 1 week by e-mail or mail. We value your feedback.  Saralyn [name], DO [facility], [place]

## 2018-04-12 NOTE — Addendum Note (Signed)
Addended by: Smitty Cords on: 04/12/2018 05:25 PM   Modules accepted: Level of Service

## 2018-06-05 ENCOUNTER — Ambulatory Visit: Payer: BC Managed Care – PPO | Admitting: Nurse Practitioner

## 2018-06-11 ENCOUNTER — Ambulatory Visit: Payer: BC Managed Care – PPO | Admitting: Nurse Practitioner

## 2018-06-18 ENCOUNTER — Other Ambulatory Visit: Payer: Self-pay

## 2018-06-18 ENCOUNTER — Other Ambulatory Visit (HOSPITAL_COMMUNITY)
Admission: RE | Admit: 2018-06-18 | Discharge: 2018-06-18 | Disposition: A | Discharge status: Home / Self Care | Payer: BC Managed Care – PPO | Source: Ambulatory Visit | Attending: Nurse Practitioner | Admitting: Nurse Practitioner

## 2018-06-18 ENCOUNTER — Ambulatory Visit (INDEPENDENT_AMBULATORY_CARE_PROVIDER_SITE_OTHER): Payer: BC Managed Care – PPO | Admitting: Nurse Practitioner

## 2018-06-18 ENCOUNTER — Encounter: Payer: Self-pay | Admitting: Nurse Practitioner

## 2018-06-18 VITALS — BP 127/75 | HR 72 | Ht 63.0 in | Wt 217.8 lb

## 2018-06-18 DIAGNOSIS — Z124 Encounter for screening for malignant neoplasm of cervix: Secondary | ICD-10-CM | POA: Insufficient documentation

## 2018-06-18 NOTE — Progress Notes (Signed)
Subjective:    Patient ID: Candice Campbell, female    DOB: [birth year], 57 y.o.   MRN: [id number]  Candice OtaDonna Teagarden is a 57 y.o. female presenting on 06/18/2018 for Gynecologic Exam   HPI Cervical Cancer Screening Breast exam and Pap only today.  Physical was performed 04/12/2018 by Dr. Althea CharonKaramalegos.  Patient reports no changes in breasts over last year.  Notes no lumps or pain.  She has had no vaginal bleeding and is postmenopausal.  Denies any vaginal discharge, genital lumps, bumps or sores.   Social History   Tobacco Use  . Smoking status: Former Smoker    Packs/day: 0.50    Years: 10.00    Pack years: 5.00    Types: Cigarettes    Last attempt to quit: 2016    Years since quitting: 3.5  . Smokeless tobacco: Never Used  . Tobacco comment: pt quit smoking 10/2016  Substance Use Topics  . Alcohol use: Yes    Alcohol/week: 0.0 oz    Comment: occasional - 6x/yr  . Drug use: No    Review of Systems Per HPI unless specifically indicated above     Objective:    BP 127/75 (BP Location: Right Arm, Patient Position: Sitting, Cuff Size: Normal)   Pulse 72   Ht 5\' 3"  (1.6 m)   Wt 217 lb 12.8 oz (98.8 kg)   BMI 38.58 kg/m   Wt Readings from Last 3 Encounters:  06/18/18 217 lb 12.8 oz (98.8 kg)  04/12/18 214 lb 12.8 oz (97.4 kg)  03/06/18 218 lb (98.9 kg)    Physical Exam  Constitutional: She is oriented to person, place, and time. She appears well-developed and well-nourished. No distress.  HENT:  Head: Normocephalic and atraumatic.  Cardiovascular: Normal rate, regular rhythm, S1 normal, S2 normal, normal heart sounds and intact distal pulses.  Pulmonary/Chest: Effort normal and breath sounds normal. No respiratory distress.  Breast - Normal exam w/ symmetric breasts, no mass, no nipple discharge, no skin changes or tenderness.  Genitourinary:  Genitourinary Comments: Normal external female genitalia without lesions or fusion. Vaginal canal without lesions. Normal appearing cervix  without lesions or friability. Physiologic discharge on exam. Bimanual exam without adnexal masses, enlarged uterus, or cervical motion tenderness.  Neurological: She is alert and oriented to person, place, and time.  Skin: Skin is warm and dry.  Psychiatric: She has a normal mood and affect. Her behavior is normal.  Vitals reviewed.  Results for orders placed or performed in visit on 04/06/18  Lipid panel  Result Value Ref Range   Cholesterol 184 <200 mg/dL   HDL 56 >82>50 mg/dL   Triglycerides 65 <956<150 mg/dL   LDL Cholesterol (Calc) 113 (H) mg/dL (calc)   Total CHOL/HDL Ratio 3.3 <5.0 (calc)   Non-HDL Cholesterol (Calc) 128 <130 mg/dL (calc)  COMPLETE METABOLIC PANEL WITH GFR  Result Value Ref Range   Glucose, Bld 106 (H) 65 - 99 mg/dL   BUN 18 7 - 25 mg/dL   Creat 2.130.93 0.860.50 - 5.781.05 mg/dL   GFR, Est Non African American 69 > OR = 60 mL/min/1.7473m2   GFR, Est African American 80 > OR = 60 mL/min/1.3973m2   BUN/Creatinine Ratio NOT APPLICABLE 6 - 22 (calc)   Sodium 136 135 - 146 mmol/L   Potassium 3.8 3.5 - 5.3 mmol/L   Chloride 100 98 - 110 mmol/L   CO2 29 20 - 32 mmol/L   Calcium 9.6 8.6 - 10.4 mg/dL   Total Protein 7.5 6.1 -  8.1 g/dL   Albumin 4.0 3.6 - 5.1 g/dL   Globulin 3.5 1.9 - 3.7 g/dL (calc)   AG Ratio 1.1 1.0 - 2.5 (calc)   Total Bilirubin 0.5 0.2 - 1.2 mg/dL   Alkaline phosphatase (APISO) 49 33 - 130 U/L   AST 16 10 - 35 U/L   ALT 10 6 - 29 U/L  CBC with Differential/Platelet  Result Value Ref Range   WBC 8.2 3.8 - 10.8 Thousand/uL   RBC 4.50 3.80 - 5.10 Million/uL   Hemoglobin 13.1 11.7 - 15.5 g/dL   HCT 16.1 09.6 - 04.5 %   MCV 86.4 80.0 - 100.0 fL   MCH 29.1 27.0 - 33.0 pg   MCHC 33.7 32.0 - 36.0 g/dL   RDW 40.9 81.1 - 91.4 %   Platelets 318 140 - 400 Thousand/uL   MPV 11.5 7.5 - 12.5 fL   Neutro Abs 4,699 1,500 - 7,800 cells/uL   Lymphs Abs 2,772 850 - 3,900 cells/uL   WBC mixed population 451 200 - 950 cells/uL   Eosinophils Absolute 189 15 - 500 cells/uL     Basophils Absolute 90 0 - 200 cells/uL   Neutrophils Relative % 57.3 %   Total Lymphocyte 33.8 %   Monocytes Relative 5.5 %   Eosinophils Relative 2.3 %   Basophils Relative 1.1 %  Hemoglobin A1c  Result Value Ref Range   Hgb A1c MFr Bld 6.0 (H) <5.7 % of total Hgb   Mean Plasma Glucose 126 (calc)   eAG (mmol/L) 7.0 (calc)      Assessment & Plan:   Problem List Items Addressed This Visit    None    Visit Diagnoses    Encounter for Papanicolaou smear for cervical cancer screening    -  Primary   Relevant Orders   Cytology - PAP    Normal clinical breast exam and GYN exam.  Pap collected with brush.  Discussed screening interval and patient prefers 5 year screening if normal cells and HPV negative. Followup 1 year annual physical with Dr. Althea Charon as scheduled.   Follow up plan: Return for as scheduled in 1 year.  [name], DNP, AGPCNP-BC Adult Gerontology Primary Care Nurse Practitioner [facility] 06/18/2018, 3:23 PM

## 2018-06-18 NOTE — Patient Instructions (Addendum)
Candice Campbell,   Thank you for coming in to clinic today.  1. PAP smear results will be mailed to your home once they have resulted.  If all are negative and cells are normal, you can repeat your PAP in 5 years.  Please schedule a follow-up appointment with Dr. Althea CharonKaramalegos, DO as scheduled in 1 year.  If you have any other questions or concerns, please feel free to call the clinic or send a message through MyChart. You may also schedule an earlier appointment if necessary.  You will receive a survey after today's visit either digitally by e-mail or paper by [employer] mail. Your experiences and feedback matter to us.  Please respond so we know how we are doing as we provide care for you.   Candice McardleLauren Daniel Johndrow, DNP, AGNP-BC Adult Gerontology Nurse Practitioner [facility], [facility]  Pap Test Why am I having this test? A pap test is sometimes called a pap smear. It is a screening test that is used to check for signs of cancer of the vagina, cervix, and uterus. The test can also identify the presence of infection or precancerous changes. Your health care provider will likely recommend you have this test done on a regular basis. This test may be done:  Every 3 years, starting at age 56.  Every 5 years, in combination with testing for the presence of human papillomavirus (HPV).  More or less often depending on other medical conditions.  What kind of sample is taken? Using a small cotton swab, plastic spatula, or brush, your health care provider will collect a sample of cells from the surface of your cervix. Your cervix is the opening to your uterus, also called a womb. Secretions from the cervix and vagina may also be collected. How do I prepare for this test?  Be aware of where you are in your menstrual cycle. You may be asked to reschedule the test if you are menstruating on the day of the test.  You may need to reschedule if you have a known vaginal infection on the day of the  test.  You may be asked to avoid douching or taking a bath the day before or the day of the test.  Some medicines can cause abnormal test results, such as digitalis and tetracycline. Talk with your health care provider before your test if you take one of these medicines. What do the results mean? Abnormal test results may indicate a number of health conditions. These may include:  Cancer. Although pap test results cannot be used to diagnose cancer of the cervix, vagina, or uterus, they may suggest the possibility of cancer. Further tests would be required to determine if cancer is present.  Sexually transmitted disease.  Fungal infection.  Parasite infection.  Herpes infection.  A condition causing or contributing to infertility.  It is your responsibility to obtain your test results. Ask the lab or department performing the test when and how you will get your results. Contact your health care provider to discuss any questions you have about your results. Talk with your health care provider to discuss your results, treatment options, and if necessary, the need for more tests. Talk with your health care provider if you have any questions about your results. This information is not intended to replace advice given to you by your health care provider. Make sure you discuss any questions you have with your health care provider. Document Released: 02/18/2003 Document Revised: 08/03/2016 Document Reviewed: 04/21/2014 Elsevier Interactive Patient  Education  2018 Elsevier Inc.  
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
## 2018-06-20 ENCOUNTER — Encounter: Payer: Self-pay | Admitting: Nurse Practitioner

## 2018-06-21 LAB — CYTOLOGY - PAP
Diagnosis: NEGATIVE
HPV: NOT DETECTED

## 2018-06-27 NOTE — Progress Notes (Deleted)
Cardiology Office Note   Date:  06/27/2018   ID:  Candice Campbell, DOB 13-Apr-1961, MRN [id number]  PCP:  Smitty Cords, DO  Cardiologist:  ***    No chief complaint on file.     History of Present Illness: Candice Campbell is a 57 y.o. female who presents for ***    Past Medical History:  Diagnosis Date  . Hypertension   . Wears dentures    full upper, partial lower    Past Surgical History:  Procedure Laterality Date  . COLONOSCOPY WITH PROPOFOL N/A 05/29/2017   Procedure: COLONOSCOPY WITH PROPOFOL;  Surgeon: Midge Minium, MD;  Location[facility];  Service: Endoscopy;  Laterality: N/A;  requests early as possible   . FOOT SURGERY Left    bone spur removal     Current Outpatient Medications  Medication Sig Dispense Refill  . fluticasone (FLONASE) 50 MCG/ACT nasal spray Place 2 sprays into both nostrils daily. Use for 4-6 weeks then stop and use seasonally or as needed. (Patient not taking: Reported on 06/18/2018) 16 g 3  . gabapentin (NEURONTIN) 100 MG capsule Start 1 capsule daily, increase by 1 cap every 2-3 days as tolerated up to 3 times a day, or may take 3 at once in evening. 30 capsule 2  . hydrochlorothiazide (HYDRODIURIL) 25 MG tablet TAKE 1 TABLET BY MOUTH ONCE DAILY 90 tablet 3  . PARoxetine (PAXIL-CR) 12.5 MG 24 hr tablet Take 1 tablet (12.5 mg total) by mouth daily. For hot flashes. 30 tablet 5  . valsartan (DIOVAN) 80 MG tablet Take 1 tablet (80 mg total) by mouth daily. 90 tablet 3   No current facility-administered medications for this visit.     Allergies:   Lisinopril    Social History:  The patient  reports that she quit smoking about 3 years ago. Her smoking use included cigarettes. She has a 5.00 pack-year smoking history. She has never used smokeless tobacco. She reports that she drinks alcohol. She reports that she does not use drugs.   Family History:  The patient's ***family history is not on file.    ROS:  General:no colds  or fevers, no weight changes Skin:no rashes or ulcers HEENT:no blurred vision, no congestion CV:see HPI PUL:see HPI GI:no diarrhea constipation or melena, no indigestion GU:no hematuria, no dysuria MS:no joint pain, no claudication Neuro:no syncope, no lightheadedness Endo:no diabetes, no thyroid disease Wt Readings from Last 3 Encounters:  06/18/18 217 lb 12.8 oz (98.8 kg)  04/12/18 214 lb 12.8 oz (97.4 kg)  03/06/18 218 lb (98.9 kg)     PHYSICAL EXAM: VS:  There were no vitals taken for this visit. , BMI There is no height or weight on file to calculate BMI. General:Pleasant affect, NAD Skin:Warm and dry, brisk capillary refill HEENT:normocephalic, sclera clear, mucus membranes moist Neck:supple, no JVD, no bruits  Heart:S1S2 RRR without murmur, gallup, rub or click Lungs:clear without rales, rhonchi, or wheezes WUJ:WJXB, non tender, + BS, do not palpate liver spleen or masses Ext:no lower ext edema, 2+ pedal pulses, 2+ radial pulses Neuro:alert and oriented, MAE, follows commands, + facial symmetry    EKG:  EKG is ordered today. The ekg ordered today demonstrates ***   Recent Labs: 04/09/2018: ALT 10; BUN 18; Creat 0.93; Hemoglobin 13.1; Platelets 318; Potassium 3.8; Sodium 136    Lipid Panel    Component Value Date/Time   CHOL 184 04/09/2018 0917   TRIG 65 04/09/2018 0917   HDL 56 04/09/2018 0917  CHOLHDL 3.3 04/09/2018 0917   VLDL 13 04/17/2017 1115   LDLCALC 113 (H) 04/09/2018 0917       Other studies Reviewed: Additional studies/ records that were reviewed today include: ***.   ASSESSMENT AND PLAN:  1.  ***   Current medicines are reviewed with the patient today.  The patient Has no concerns regarding medicines.  The following changes have been made:  See above Labs/ tests ordered today include:see above  Disposition:   FU:  see above  Signed, Nada BoozerLaura Ingold, NP  06/27/2018 9:48 PM    [facility] [address],  [place], [place]  [address]/ [address] [place], [place] Phone: [phone](336) [phone]; Fax: [phone]  [phone]

## 2018-06-28 ENCOUNTER — Ambulatory Visit: Payer: BC Managed Care – PPO | Admitting: Cardiology

## 2018-07-16 ENCOUNTER — Ambulatory Visit: Payer: BC Managed Care – PPO | Admitting: Family Medicine

## 2018-07-16 ENCOUNTER — Encounter: Payer: Self-pay | Admitting: Family Medicine

## 2018-07-16 ENCOUNTER — Ambulatory Visit (INDEPENDENT_AMBULATORY_CARE_PROVIDER_SITE_OTHER): Payer: BC Managed Care – PPO | Admitting: Family Medicine

## 2018-07-16 VITALS — BP 126/80 | HR 86 | Temp 98.3°F | Resp 16 | Ht 63.0 in | Wt 216.0 lb

## 2018-07-16 DIAGNOSIS — Z1231 Encounter for screening mammogram for malignant neoplasm of breast: Secondary | ICD-10-CM

## 2018-07-16 DIAGNOSIS — M722 Plantar fascial fibromatosis: Secondary | ICD-10-CM

## 2018-07-16 DIAGNOSIS — R7309 Other abnormal glucose: Secondary | ICD-10-CM | POA: Diagnosis not present

## 2018-07-16 DIAGNOSIS — E669 Obesity, unspecified: Secondary | ICD-10-CM | POA: Diagnosis not present

## 2018-07-16 DIAGNOSIS — I10 Essential (primary) hypertension: Secondary | ICD-10-CM

## 2018-07-16 DIAGNOSIS — Z1239 Encounter for other screening for malignant neoplasm of breast: Secondary | ICD-10-CM

## 2018-07-16 LAB — POCT GLYCOSYLATED HEMOGLOBIN (HGB A1C): Hemoglobin A1C: 5.8 % — AB (ref 4.0–5.6)

## 2018-07-16 MED ORDER — HYDROCHLOROTHIAZIDE 25 MG PO TABS: 25.0000 mg | ORAL_TABLET | Freq: Every day | ORAL | 3 refills | Status: DC

## 2018-07-16 NOTE — Assessment & Plan Note (Signed)
Clinically consistent with subacute on chronic R plantar fasciitis based on history Known prior heel spur - No known acute injury. Unlikely fracture based on history, no bony tenderness. - Limited conservative therapy L side plantar fasciitis - treated by [facility] [place]   Plan: 1. Reviewed diagnosis and management of plantar fasciitis 2. May add NSAID if need otherwise May take Tylenol PRN breakthrough 3. Can use topical muscle rub, ice 4. Emphasized importance of relative rest, ice, and avoid prolonged standing and overuse 5. Handout given and explained appropriate stretching and home exercises important to do first thing in morning and later 6. Follow-up 4-6 weeks if not improved, consider referral to Podiatry or [facility] for [facility] injection, imaging, and future consideration of surgery if worsening

## 2018-07-16 NOTE — Patient Instructions (Addendum)
Thank you for coming to the office today.  A1c today improved 5.8 - previously 6.0 Keep up the good work.  Return for Nurse Visit for Flu Shot in September or October 2019 - schedule this in advance.  For Mammogram screening for breast cancer   Call the Imaging Center below anytime to schedule your own appointment now that order has been placed.  [facility] [address] [place], [place] [address] Phone: [phone]([phone]   Consider less jogging maybe add swimming instead or just more walking.  Plantar Fascitis most likely for R foot now with heel spur  Try to stretch it out first thing in morning and before exertion or activity  Recommend to start taking Tylenol Extra Strength 500mg  tabs - take 1 to 2 tabs per dose (max 1000mg ) every 6-8 hours for pain (take regularly, don't skip a dose for next 7 days), max 24 hour daily dose is 6 tablets or 3000mg . In the future you can repeat the same everyday Tylenol course for 1-2 weeks at a time.   Think about:  [facility] Address: [address], [place], [place] [address] Hours: Open 8AM-5PM Phone: [phone](336) [phone]  [facility] [address] [place], [place] [address] Hours - M-F 8-5 Phone: [phone](336) [phone] phone    Please schedule a Follow-up Appointment to: Return in about 6 months (around 01/16/2019) for PreDM A1c, HTN, Weight.  If you have any other questions or concerns, please feel free to call the office or send a message through MyChart. You may also schedule an earlier appointment if necessary.  Additionally, you may be receiving a survey about your experience at our office within a few days to 1 week by e-mail or mail. We value your feedback.  Candice PilarAlexander Lavere Shinsky, DO [facility]              Plantar Fascia Stretches / Exercises  See other page with pictures of each exercise.  Start with 1 or 2 of these  exercises that you are most comfortable with. Do not do any exercises that cause you significant worsening pain. Some of these may cause some "stretching soreness" but it should go away after you stop the exercise, and get better over time. Gradually increase up to 3-4 exercises as tolerated.  You may begin exercising the muscles of your foot right away by gently stretching them as follows:  Stretching: Towel stretch: Sit on a hard surface with your injured leg stretched out in front of you. Loop a towel around the ball of your foot and pull the towel toward your body keeping your knee straight. Hold this position for 15 to 30 seconds then relax. Repeat 3 times. When the towel stretch becomes to easy, you may begin doing the standing calf stretch.  Standing calf stretch: Facing a wall, put your hands against the wall at about eye level. Keep the injured leg back, the uninjured leg forward, and the heel of your injured leg on the floor. Turn your injured foot slightly inward (as if you were pigeon-toed) as you slowly lean into the wall until you feel a stretch in the back of your calf. Hold for 15 to 30 seconds. Repeat 3 times. Do this exercise several times each day. When you can stand comfortably on your injured foot, you can begin stretching the bottom of your foot using the plantar fascia stretch.  Plantar fascia stretch: Stand with the ball of  your injured foot on a stair. Reach for the bottom step with your heel until you feel a stretch in the arch of your foot. Hold this position for 15 to 30 seconds and then relax. Repeat 3 times. After you have stretched the bottom muscles of your foot, you can begin strengthening the top muscles of your foot.  Frozen can roll: Roll your bare injured foot back and forth from your heel to your mid-arch over a frozen juice can. Repeat for 3 to 5 minutes. This exercise is particularly helpful if done first thing in the morning. Towel pickup: With your heel on the  ground, pick up a towel with your toes. Release. Repeat 10 to 20 times. When this gets easy, add more resistance by placing a book or small weight on the towel. Static and dynamic balance exercises Place a chair next to your non-injured leg and stand upright. (This will provide you with balance if needed.) Stand on your injured foot. Try to raise the arch of your foot while keeping your toes on the floor. Try to maintain this position and balance on your injured side for 30 seconds. This exercise can be made more difficult by doing it on a piece of foam or a pillow, or with your eyes closed. Stand in the same position as above. Keep your foot in this position and reach forward in front of you with your injured side's hand, allowing your knee to bend. Repeat this 10 times while maintaining the arch height. This exercise can be made more difficult by reaching farther in front of you. Do 2 sets. Stand in the same position as above. While maintaining your arch height, reach the injured side's hand across your body toward the chair. The farther you reach, the more challenging the exercise. Do 2 sets of 10.  Next, you can begin strengthening the muscles of your foot and lower leg by using elastic tubing.  Strengthening: Resisted dorsiflexion: Sit with your injured leg out straight and your foot facing a doorway. Tie a loop in one end of the tubing. Put your foot through the loop so that the tubing goes around the arch of your foot. Tie a knot in the other end of the tubing and shut the knot in the door. Move backward until there is tension in the tubing. Keeping your knee straight, pull your foot toward your body, stretching the tubing. Slowly return to the starting position. Do 3 sets of 10. Resisted plantar flexion: Sit with your leg outstretched and loop the middle section of the tubing around the ball of your foot. Hold the ends of the tubing in both hands. Gently press the ball of your foot down and point  your toes, stretching the tubing. Return to the starting position. Do 3 sets of 10. Resisted inversion: Sit with your legs out straight and cross your uninjured leg over your injured ankle. Wrap the tubing around the ball of your injured foot and then loop it around your uninjured foot so that the tubing is anchored there at one end. Hold the other end of the tubing in your hand. Turn your injured foot inward and upward. This will stretch the tubing. Return to the starting position. Do 3 sets of 10. Resisted eversion: Sit with both legs stretched out in front of you, with your feet about a shoulder's width apart. Tie a loop in one end of the tubing. Put your injured foot through the loop so that the tubing goes around  the arch of that foot and wraps around the outside of the uninjured foot. Hold onto the other end of the tubing with your hand to provide tension. Turn your injured foot up and out. Make sure you keep your uninjured foot still so that it will allow the tubing to stretch as you move your injured foot. Return to the starting position. Do 3 sets of 10.

## 2018-07-16 NOTE — Assessment & Plan Note (Addendum)
Well-controlled HTN, now further improved w/ weight loss and lifestyle change - Home BP readings normal  No known complications     Plan:  1. Continue current therapy HCTZ 25mg  daily, Valsartan 80mg  daily 2. Encourage improved lifestyle - keep improving diet and exercise as planned 3. 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

## 2018-07-16 NOTE — Progress Notes (Signed)
Subjective:    Patient ID: Candice Campbell, female    DOB: 19-Sep-1961, 57 y.o.   MRN: [id number]  Candice Campbell is a 57 y.o. female presenting on 07/16/2018 for elevated A1C and Hypertension   HPI   CHRONIC HTN: Reports no new concerns, doing well on medications. 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 Current Meds -Valsartan 80mg , HCTZ 25mg  daily Reports good compliance, took meds today. Tolerating well, w/o complaints. Denies CP, dyspnea, HA, edema, dizziness / lightheadedness  Elevated A1c / Obesity BMI >38 Previous trend A1c 5.5 up to 6.0 last checked. Now today A1c 5.8 improved. She has improved lifestyle. Currently on ARB Lifestyle: - Weight down 2 lbs in 1 month, otherwise no significant change - Diet (improved lower sugar lower carb options)  - Exercise (more regular exercise, gym x 4 weekly walking, jogging) No prior PreDM or DM - Father and Sister passed away with diabetes Denies hypoglycemia, polyuria, visual changes, numbness or tingling.  Plantar Fasciitis, Right - History of L foot plantar fasciitis, previously [facility] by [facility]lsie [facility]les [facility]) and she had prior injection and surgery back in 2016 with good results. Now gradual worsening R foot similar problem as before, seems to be sore across mid lower foot and heel, was told she had a bone spur, she states it is more sore initially and then improves throughout the day, not doing any exercise for it, admits worse with jogging. Not taking anti inflammatories currently  Health Maintenance: Due for Flu Shot, no available vaccines at this time in August - she will contact office to check back status in 1-2 months and return for nurse only visit   Breast CA Screening: Due for mammogram screening. Last mammogram result >4 years ago negative. No prior history abnormal mammogram. No known family history of breast cancer. Currently asymptomatic.   Depression screen [facility] 2/9 07/16/2018 04/12/2018 03/06/2018  Decreased Interest 0 0 0   Down, Depressed, Hopeless 0 0 0  PHQ - 2 Score 0 0 0  Altered sleeping - 0 0  Tired, decreased energy - 0 0  Change in appetite - 0 0  Feeling bad or failure about yourself  - 0 0  Trouble concentrating - 0 0  Moving slowly or fidgety/restless - 0 0  Suicidal thoughts - 0 0  PHQ-9 Score - 0 0  Difficult doing work/chores - Not difficult at all Not difficult at all   History reviewed. No pertinent family history.   Social History   Tobacco Use  . Smoking status: Former Smoker    Packs/day: 0.50    Years: 10.00    Pack years: 5.00    Types: Cigarettes    Last attempt to quit: 2016    Years since quitting: 3.5  . Smokeless tobacco: Never Used  . Tobacco comment: pt quit smoking 10/2016  Substance Use Topics  . Alcohol use: Yes    Alcohol/week: 0.0 oz    Comment: occasional - 6x/yr  . Drug use: No    Review of Systems Per HPI unless specifically indicated above     Objective:    BP 126/80   Pulse 86   Temp 98.3 F (36.8 C) (Oral)   Resp 16   Ht 5\' 3"  (1.6 m)   Wt 216 lb (98 kg)   BMI 38.26 kg/m   Wt Readings from Last 3 Encounters:  07/16/18 216 lb (98 kg)  06/18/18 217 lb 12.8 oz (98.8 kg)  04/12/18 214 lb 12.8 oz (97.4  kg)    Physical Exam  Constitutional: She is oriented to person, place, and time. She appears well-developed and well-nourished. No distress.  Well-appearing, comfortable, cooperative  HENT:  Head: Normocephalic and atraumatic.  Mouth/Throat: Oropharynx is clear and moist.  Eyes: Conjunctivae are normal. Right eye exhibits no discharge. Left eye exhibits no discharge.  Cardiovascular: Normal rate.  Pulmonary/Chest: Effort normal.  Musculoskeletal: She exhibits no edema.  Neurological: She is alert and oriented to person, place, and time.  Skin: Skin is warm and dry. No rash noted. She is not diaphoretic. No erythema.  Psychiatric: She has a normal mood and affect. Her behavior is normal.  Well groomed, good eye contact, normal speech  and thoughts  Nursing note and vitals reviewed.   Recent Labs    04/09/18 0917 07/16/18 1011  HGBA1C 6.0* 5.8*    Results for orders placed or performed in visit on 07/16/18  POCT glycosylated hemoglobin (Hb A1C)  Result Value Ref Range   Hemoglobin A1C 5.8 (A) 4.0 - 5.6 %   HbA1c POC (<> result, manual entry)  4.0 - 5.6 %   HbA1c, POC (prediabetic range)  5.7 - 6.4 %   HbA1c, POC (controlled diabetic range)  0.0 - 7.0 %      Assessment & Plan:   Problem List Items Addressed This Visit    Elevated hemoglobin A1c - Primary    Improved A1c down to 5.8, previously 6.0 No prior dx PreDM At risk with fam history DM Concern with obesity, HTN, HLD  Plan:  1. Not on any therapy currently  2. Encourage improved lifestyle - low carb, low sugar diet, reduce portion size, continue improving regular exercise as planned 3. Follow-up 6 months for POC A1c      Relevant Orders   POCT glycosylated hemoglobin (Hb A1C) (Completed)   Essential hypertension    Well-controlled HTN, now further improved w/ weight loss and lifestyle change - Home BP readings normal  No known complications     Plan:  1. Continue current therapy HCTZ 25mg  daily, Valsartan 80mg  daily 2. Encourage improved lifestyle - keep improving diet and exercise as planned 3. 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 Unlikely fracture based on history, no bony tenderness. - Limited  conservative therapy L side plantar fasciitis - [facility]   Plan: 1. Reviewed diagnosis and management of plantar fasciitis 2. May add NSAID if need otherwise May take Tylenol PRN breakthrough 3. Can use topical muscle rub, ice 4. Emphasized importance of relative rest, ice, and avoid prolonged standing and overuse 5. Handout given and explained appropriate stretching and home exercises important to do first thing in morning and later 6. Follow-up 4-6 weeks if not improved, consider referral to Podiatry or [facility] for possible injection, imaging, and future consideration of surgery if worsening       Other Visit Diagnoses    Screening for breast  cancer       Relevant Orders   MM DIGITAL SCREENING BILATERAL      Meds ordered this encounter  Medications  . hydrochlorothiazide (HYDRODIURIL) 25 MG tablet    Sig: Take 1 tablet (25 mg total) by mouth daily.    Dispense:  90 tablet    Refill:  3    Follow up plan: Return in about 6 months (around 01/16/2019) for PreDM A1c, HTN, Weight.  Saralyn [name], DO [facility] 07/16/2018, 11:56 AM

## 2018-07-16 NOTE — Assessment & Plan Note (Signed)
Improved weight loss still with lifestyle changes diet and exercise Encourage continue current plan

## 2018-07-16 NOTE — Assessment & Plan Note (Signed)
Improved A1c down to 5.8, previously 6.0 No prior dx PreDM At risk with fam history DM Concern with obesity, HTN, HLD  Plan:  1. Not on any therapy currently  2. Encourage improved lifestyle - low carb, low sugar diet, reduce portion size, continue improving regular exercise as planned 3. Follow-up 6 months for POC A1c

## 2019-01-21 ENCOUNTER — Ambulatory Visit: Payer: BC Managed Care – PPO | Admitting: Family Medicine

## 2019-03-19 ENCOUNTER — Encounter: Payer: Self-pay | Admitting: Family Medicine

## 2019-03-19 ENCOUNTER — Other Ambulatory Visit: Payer: Self-pay

## 2019-03-19 ENCOUNTER — Ambulatory Visit (INDEPENDENT_AMBULATORY_CARE_PROVIDER_SITE_OTHER): Payer: BC Managed Care – PPO | Admitting: Family Medicine

## 2019-03-19 DIAGNOSIS — F4323 Adjustment disorder with mixed anxiety and depressed mood: Secondary | ICD-10-CM

## 2019-03-19 DIAGNOSIS — F410 Panic disorder [episodic paroxysmal anxiety] without agoraphobia: Secondary | ICD-10-CM | POA: Diagnosis not present

## 2019-03-19 DIAGNOSIS — F411 Generalized anxiety disorder: Secondary | ICD-10-CM | POA: Diagnosis not present

## 2019-03-19 MED ORDER — PAROXETINE HCL 10 MG PO TABS: 10.0000 mg | ORAL_TABLET | Freq: Every day | ORAL | 2 refills | Status: DC

## 2019-03-19 NOTE — Progress Notes (Signed)
Virtual Visit via Telephone The purpose of this virtual visit is to provide medical care while limiting exposure to the novel coronavirus (COVID19) for both patient and office staff.  Consent was obtained for phone visit:  Yes.   Answered questions that patient had about telehealth interaction:  Yes.   I discussed the limitations, risks, security and privacy concerns of performing an evaluation and management service by telephone. I also discussed with the patient that there may be a patient responsible charge related to this service. The patient expressed understanding and agreed to proceed.  Patient Location: Home Provider Location: [facility])  ---------------------------------------------------------------------- Chief Complaint  Patient presents with  . 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 I have called patient and gathered additional HPI as follows:  Acute Adjustment Disorder with Depression and Anxiety with Panic Attack Patient has no known prior history of anxiety or depression. Now with COVID19 pandemic concerns she is extremely worried, depressed, anxious and having episodic panic attacks. 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 Denies any known or suspected exposure to person with or possibly with COVID19.  Denies any fevers, chills, sweats, body ache, cough, shortness of breath, sinus pain or pressure, headache, abdominal pain, diarrhea  Past Medical History:  Diagnosis Date  . Obesity (BMI 35.0-39.9 without comorbidity) 10/20/2015  . Wears dentures    full upper, partial lower  . Whiplash injury to neck 01/09/2017   Social History   Tobacco Use  . Smoking status: Former Smoker    Packs/day: 0.50    Years: 10.00    Pack years: 5.00    Types: Cigarettes    Last attempt to quit: 2016    Years since quitting: 4.2  . Smokeless tobacco: Never Used  . Tobacco comment: pt quit smoking 10/2016  Substance Use Topics  . Alcohol use: Yes    Alcohol/week: 0.0 standard drinks    Comment: occasional - 6x/yr  . Drug use: No    Current Outpatient Medications:  .  fluticasone (FLONASE) 50 MCG/ACT nasal spray, Place 2 sprays into both nostrils daily. Use for 4-6 weeks then stop and use seasonally or as needed., Disp: 16 g, Rfl: 3 .  hydrochlorothiazide (HYDRODIURIL) 25 MG tablet, Take 1 tablet (25 mg total) by mouth daily., Disp: 90 tablet, Rfl: 3 .  valsartan (DIOVAN) 80 MG tablet, Take 1 tablet (80 mg total) by mouth daily., Disp: 90 tablet, Rfl: 3 .  PARoxetine (PAXIL) 10 MG tablet, Take 1 tablet (10 mg total) by mouth daily. After  2 to 4 weeks, may increase to 2 pills every day for 20mg , Disp: 30 tablet, Rfl: 2  [facility] 2[facility] 03/19/2019 07/16/2018 04/12/2018  Decreased Interest 2 0 0  Down, Depressed, Hopeless 2 0 0  PHQ - 2 Score 4 0 0  Altered sleeping 3 - 0  Tired, decreased energy 2 - 0  Change in appetite 2 - 0  Feeling bad or failure about yourself  2 - 0  Trouble concentrating 1 - 0  Moving slowly or fidgety/restless 2 - 0  Suicidal thoughts 0 - 0  PHQ-9 Score 16 - 0   Difficult doing work/chores Somewhat difficult - Not difficult at all    GAD 7 : Generalized Anxiety Score 03/19/2019  Nervous, Anxious, on Edge 2  Control/stop worrying 2  Worry too much - different things 2  Trouble relaxing 3  Restless 2  Easily annoyed or irritable 2  Afraid - awful might happen 1  Total GAD 7 Score 14  Anxiety Difficulty Somewhat difficult    -------------------------------------------------------------------------- O: No physical exam performed due to remote telephone encounter.  Lab results reviewed.  No results found for this or any previous visit (from the past 2160 hour(s)).  -------------------------------------------------------------------------- A&P:  Problem List Items Addressed This Visit    Acute adjustment disorder with mixed anxiety and depressed mood - Primary   Relevant Medications   PARoxetine (PAXIL) 10 MG tablet     Suspected new dx acute adjustment disorder mixed mood with GAD and some panic Primary trigger is COVID19 pandemic putting her and her daughter at risk with their work in community and her daughter at hospital Affecting her function on multiple accounts, and see symptoms PHQ GAD, secondary insomnia No prior dx psych No prior med except Paxil-CR 12.5mg  for hot flash off now  Plan: 1. Discussion on new diagnosis anxiety, management, complications, likely contributing to insomnia 2. Start back on SSRI Paxil 10mg  daily now for both mood/anxiety - side effects reviewed previously on same medicine she has been on, she may increase up to 2 pills = 20mg  daily in future if tolerated well and need higher potency 3. Future offer other meds Hydroxyzine PRN for panic or switch SSRI or buspar if need 4. Future consider refer to Therapist for counseling if need as well 5. I cannot write her a work note for reason to not work at this time - she does not meet high risk COVID19 medical criteria at this time - but I did offer to write a  letter stating that she was diagnosed with these new mental health problems and is being treated, due to COVID19 fears and anxiety - this will be up to her employer  Follow-up 2-6 weeks anxiety, med adjust, GAD7/PHQ9    Meds ordered this encounter  Medications  . PARoxetine (PAXIL) 10 MG tablet    Sig: Take 1 tablet (10 mg total) by mouth daily. After 2 to 4 weeks, may increase to 2 pills every day for 20mg     Dispense:  30 tablet    Refill:  2    Follow-up: - Return in 4 weeks for virtual visit update Adjustment mood anxiety PHQ GAD  Patient verbalizes understanding with the above medical recommendations including the limitation of remote medical advice.  Specific follow-up and call-back criteria were given for patient to follow-up or seek medical care more urgently if needed.   - Time spent in direct consultation with patient on phone: 13 minutes  Saralyn PilarAlexander Sephora Boyar, DO [place]  [facility] 03/19/2019, 9:44 AM

## 2019-03-19 NOTE — Patient Instructions (Addendum)
Start paxil 10mg  daily in morning with food every day, may take 2-4 weeks to take full effect  If after 2-4 weeks it is not effective or you feel like higher dose would be beneficial then we can increase up to 20mg  - you can double your dose and notify me to increase the rx strength  I have written letter describing your current health situation pertaining to the anxiety with COVID19  I cannot write a medical excuse at this time to limit you due to not having high risk medical criteria for complications of COVID19, hopefully this is helpful.  If you need any additional help or other resources please reach out to [place] - we can arrange for therapist  If thoughts of harming yourself or others, or any significant concern about your safety, please call for help immediately:  [facility] 24 Hour Help Line [phone] or [phone] - Suicide prevention hotline [phone]  - 911  Follow-up within 2 to 6 week for mood/anxiety

## 2019-05-03 ENCOUNTER — Telehealth: Payer: Self-pay | Admitting: Family Medicine

## 2019-05-03 ENCOUNTER — Other Ambulatory Visit: Payer: Self-pay | Admitting: Family Medicine

## 2019-05-03 DIAGNOSIS — I10 Essential (primary) hypertension: Secondary | ICD-10-CM

## 2019-07-19 ENCOUNTER — Telehealth: Payer: Self-pay | Admitting: Family Medicine

## 2019-07-31 ENCOUNTER — Encounter: Payer: Self-pay | Admitting: Family Medicine

## 2019-07-31 ENCOUNTER — Ambulatory Visit (INDEPENDENT_AMBULATORY_CARE_PROVIDER_SITE_OTHER): Payer: BC Managed Care – PPO | Admitting: Family Medicine

## 2019-07-31 ENCOUNTER — Other Ambulatory Visit: Payer: Self-pay

## 2019-07-31 VITALS — BP 127/68 | HR 77 | Temp 97.5°F | Resp 16 | Ht 63.0 in | Wt 240.0 lb

## 2019-07-31 DIAGNOSIS — M542 Cervicalgia: Secondary | ICD-10-CM | POA: Diagnosis not present

## 2019-07-31 DIAGNOSIS — F4323 Adjustment disorder with mixed anxiety and depressed mood: Secondary | ICD-10-CM

## 2019-07-31 DIAGNOSIS — S46912A Strain of unspecified muscle, fascia and tendon at shoulder and upper arm level, left arm, initial encounter: Secondary | ICD-10-CM

## 2019-07-31 DIAGNOSIS — F5101 Primary insomnia: Secondary | ICD-10-CM | POA: Insufficient documentation

## 2019-07-31 DIAGNOSIS — N951 Menopausal and female climacteric states: Secondary | ICD-10-CM | POA: Diagnosis not present

## 2019-07-31 DIAGNOSIS — G8929 Other chronic pain: Secondary | ICD-10-CM

## 2019-07-31 DIAGNOSIS — R519 Headache, unspecified: Secondary | ICD-10-CM

## 2019-07-31 DIAGNOSIS — R51 Headache: Secondary | ICD-10-CM

## 2019-07-31 MED ORDER — TRAZODONE HCL 50 MG PO TABS: 50.0000 mg | ORAL_TABLET | Freq: Every day | ORAL | 2 refills | Status: DC

## 2019-07-31 NOTE — Progress Notes (Signed)
Subjective:    Patient ID: Candice Campbell, female    DOB: 04/06/1961, 58 y.o.   MRN: [id number]  Candice Campbell is a 58 y.o. female presenting on 07/31/2019 for Headache (fatigue) and Fall (slipped off from steps  as per patient 8 days ago)   HPI   Chronic Headaches / Fall / Back shoulder pain New problem, frontal headaches mild 3 out of 10 severity, about 2 x a week, described as dull ache, lasting up to 1-2 days - Taking Ibuprofen PRN with relief usually prior to bed or during day q 6 hour PRN with good results - She has been on Paxil longer term now >1 year for hot flashes, limited relief, in past on gabapentin with mixed results - No history of migraines - Admits to fall recently did not injure head, she slipped on steps and hit her back / shoulder against steps, area of similar pain and muscle spasm from before - Denies nausea vomiting, vision changes, numbness tingling weakness, photo or phonophobia  Insomnia / Adjustment Disorder Anxiety Depression See prior note 03/2019 with life stressors affecting her mood/anxiety, still persistent issues now affecting her, admits some mood problem. - She admits poor sleep often, waking up for various reasons, says she is fatigued more and tired during day at times due to poor sleep. - See above, taking Paxil, limited relief Denies any trial of sleeping meds before    Dep[facility] screen [facility] 2[facility]9 07/31/2019 03/19/2019 07/16/2018  Decreased Interest 2 2 0  Down, Depressed, Hopeless 1 2 0  PHQ - 2 Score 3 4 0  Altered sleeping 2 3 -  Tired, decreased energy 2 2 -  Change in appetite 1 2 -  Feeling bad or failure about yourself  1 2 -  Trouble concentrating 1 1 -  Moving slowly or fidgety/restless 1 2 -  Suicidal thoughts 0 0 -  PHQ-9 Score 11 16 -  Difficult doing work/chores Somewhat difficult Somewhat difficult -   GAD 7 : Generalized Anxiety Score 07/31/2019 03/19/2019  Nervous, Anxious, on Edge 1 2  Control/stop worrying 2 2  Worry too much -  different things 2 2  Trouble relaxing 1 3  Restless 0 2  Easily annoyed or irritable 2 2  Afraid - awful might happen 0 1  Total GAD 7 Score 8 14  Anxiety Difficulty Somewhat difficult Somewhat difficult      Social History   Tobacco Use  . Smoking status: Former Smoker    Packs/day: 0.50    Years: 10.00    Pack years: 5.00    Types: Cigarettes    Quit date: 2016    Years since quitting: 4.6  . Smokeless tobacco: Never Used  . Tobacco comment: pt quit smoking 10/2016  Substance Use Topics  . Alcohol use: Yes    Alcohol/week: 0.0 standard drinks    Comment: occasional - 6x/yr  . Drug use: No    Review of Systems Per HPI unless specifically indicated above     Objective:    BP 127/68   Pulse 77   Temp (!) 97.5 F (36.4 C) (Oral)   Resp 16   Ht 5\' 3"  (1.6 m)   Wt 240 lb (108.9 kg)   BMI 42.51 kg/m   Wt Readings from Last 3 Encounters:  07/31/19 240 lb (108.9 kg)  07/16/18 216 lb (98 kg)  06/18/18 217 lb 12.8 oz (98.8 kg)    Physical Exam Vitals signs and nursing note reviewed.  Constitutional:      General: She is not in acute distress.    Appearance: She is well-developed. She is not diaphoretic.     Comments: Well-appearing, comfortable, cooperative  HENT:     Head: Normocephalic and atraumatic.  Eyes:     General:        Right eye: No discharge.        Left eye: No discharge.     Conjunctiva/sclera: Conjunctivae normal.  Cardiovascular:     Rate and Rhythm: Normal rate.  Pulmonary:     Effort: Pulmonary effort is normal.  Skin:    General: Skin is warm and dry.     Findings: No erythema or rash.  Neurological:     Mental Status: She is alert and oriented to person, place, and time.     Sensory: No sensory deficit.     Motor: No weakness.  Psychiatric:        Behavior: Behavior normal.     Comments: Well groomed, good eye contact, normal speech and thoughts      Results for orders placed or performed in visit on 07/16/18  POCT  glycosylated hemoglobin (Hb A1C)  Result Value Ref Range   Hemoglobin A1C 5.8 (A) 4.0 - 5.6 %   HbA1c POC (<> result, manual entry)  4.0 - 5.6 %   HbA1c, POC (prediabetic range)  5.7 - 6.4 %   HbA1c, POC (controlled diabetic range)  0.0 - 7.0 %      Assessment & Plan:   Problem List Items Addressed This Visit    Acute adjustment disorder with mixed anxiety and depressed mood   Relevant Medications   traZODone (DESYREL) 50 MG tablet   Chronic neck pain   Relevant Medications   traZODone (DESYREL) 50 MG tablet   Hot flashes due to menopause   Muscle strain of left scapular region   Primary insomnia - Primary   Relevant Medications   traZODone (DESYREL) 50 MG tablet    Other Visit Diagnoses    Nonintractable episodic headache, unspecified headache type       Relevant Medications   traZODone (DESYREL) 50 MG tablet      Clinically with constellation of symptoms Uncertain if headache is secondary to poor sleep/fatigue, or if it is unrelated, can be with med side effect on SSRI paxil as possibility, or with shoulder/neck pain injury again but this occurred after headaches - No other complicating factors for headache or concerning features  Plan - taper off SSRI Paxil, over 3-4 weeks per AVS, since ineffective - START Trazodone 50mg  nightly, dose adjust as advised for mood and insomnia, goal to improve sleep to reduce headache and fatigue - Considered gabapentin for both neck/back pain, headache and sleep but defer for now, she cannot remember why discontinued it in past - Follow-up as planned  Meds ordered this encounter  Medications  . traZODone (DESYREL) 50 MG tablet    Sig: Take 1 tablet (50 mg total) by mouth at bedtime.    Dispense:  30 tablet    Refill:  2     Follow up plan: Return in about 6 weeks (around 09/11/2019) for 6 wk follow-up PreDM A1c, Headache / Mood PHQ follow-up.    Nobie Putnam, [facility]  07/31/2019, 4:23 PM

## 2019-07-31 NOTE — Patient Instructions (Addendum)
Thank you for coming to the office today.  For headaches - May get better with improved sleep - Also can take Ibuprofen 200mg  tabs - take 1 to 3 pills per dose with food up to 3 times daily as needed max - Consider Tylenol Extra Strength 500mg  tabs - take 1 to 2 tabs per dose (max 1000mg ) every 6-8 hours for pain, max 24 hour daily dose is 6 tablets or 3000mg .   Start Trazodone 50mg  nightly for mood and sleep. After 2 to 4 weeks, if helping some but not quite effective can increase dose to ONE AND HALF Pill or can try TWO pills - call if need help.  Future we may add back on the Gabapentin - this is a nerve pain pill that can help headache or make you sleepy  Medication Taper Off Instructions Current med - Paxil 10mg   Week 1: Alternate every OTHER day - one 10mg  (whole tab) and then next day 5mg  (HALF tab) Week 2: 5 mg daily (HALF tablet) Week 3: 5mg  every OTHER day (HALF tablet) - alternating day is NO medicine (skip dose) Week: 4 STOP  completely  Optional weeks 5-6: you may extend the week 4 plan for up to 3 weeks if need. Alternating half pill one day and no pill the next. May space out to every 3 days if need.  --------------------  Sleep Hygiene Recommendations to promote healthy sleep in all patients, especially if symptoms of insomnia are worsening. Due to the nature of sleep rhythms, if your body gets "out of rhythm", it may take some time before your sleep cycle can be "reset".  Please try to follow as many of the following tips as you can, usually there are only a few of these are the primary cause of the problem.  ?To reset your sleep rhythm, go to bed and get up at the same time every day ?Sleep only long enough to feel rested and then get out of bed ?Do not try to force yourself to sleep. If you can't sleep, get out of bed and try again later. ?Avoid naps during the day, unless excessively tired. The more sleeping during the day, then the less sleep your body needs at  night.  ?Have coffee, tea, and other foods that have caffeine only in the morning ?Exercise several days a week, but not right before bed ?If you drink alcohol, prefer to have appropriate drink with one meal, but prefer to avoid alcohol in the evening, and bedtime ?If you smoke, avoid smoking, especially in the evening  ?Avoid watching TV or looking at phones, computers, or reading devices ("e-books") that give off light at least 30 minutes before bed. This artificial light sends "awake signals" to your brain and can make it harder to fall asleep. ?Make your bedroom a comfortable place where it is easy to fall asleep: ? Put up shades or special blackout curtains to block light from outside. ? Use a white noise machine to block noise. ? Keep the temperature cool. ?Try your best to solve or at least address your problems before you go to bed ?Use relaxation techniques to manage stress. Ask your health care provider to suggest some techniques that may work well for you. These may include: ? Breathing exercises. ? Routines to release muscle tension. ? Visualizing peaceful scenes.   Please schedule a Follow-up Appointment to: Return in about 6 weeks (around 09/11/2019) for 6 wk follow-up PreDM A1c, Headache / Mood PHQ follow-up.  If you  have any other questions or concerns, please feel free to call the office or send a message through [facility]. You may also schedule an earlier appointment if necessary.  Additionally, you may be receiving a survey about your experience at our office within a few days to 1 week by e-mail or mail. We value your feedback.  Nobie Putnam, DO [place]

## 2019-08-05 ENCOUNTER — Other Ambulatory Visit: Payer: Self-pay | Admitting: Family Medicine

## 2019-08-05 DIAGNOSIS — I10 Essential (primary) hypertension: Secondary | ICD-10-CM

## 2019-08-06 ENCOUNTER — Encounter: Payer: Self-pay | Admitting: Family Medicine

## 2019-08-06 ENCOUNTER — Other Ambulatory Visit: Payer: Self-pay

## 2019-08-06 ENCOUNTER — Ambulatory Visit (INDEPENDENT_AMBULATORY_CARE_PROVIDER_SITE_OTHER): Payer: BC Managed Care – PPO | Admitting: Family Medicine

## 2019-08-06 VITALS — BP 136/91 | HR 79 | Temp 98.2°F | Resp 16 | Ht 63.0 in | Wt 240.0 lb

## 2019-08-06 DIAGNOSIS — M722 Plantar fascial fibromatosis: Secondary | ICD-10-CM | POA: Diagnosis not present

## 2019-08-06 MED ORDER — NAPROXEN 500 MG PO TABS: 500.0000 mg | ORAL_TABLET | Freq: Two times a day (BID) | ORAL | 0 refills | Status: DC

## 2019-08-06 NOTE — Progress Notes (Signed)
Subjective:    Patient ID: Candice Campbell, female    DOB: 25-Jan-1961, 58 y.o.   MRN: [id number]  Candice Campbell is a 58 y.o. female presenting on 08/06/2019 for Foot Pain (left side intermitten one month )   HPI   LEFT FOOT PAIN / Plantar Fasciitis Known prior history of L foot plantar fasciitis previously 2015-16, [facility] Dr Paulla Dolly plantar fasciitis, s/p surgery and treatment at that time received injections as well. - Now she reports recurrence of Left foot pain, intermittently over past 1 month, worse with recent prolong standing but worse in morning when start walking or if not stretched or if seated, first steps hurt worse, pain radiates into lower leg at times - Not tried any medications for it OTC no nsaid or tylenol or muscle rub, she used to do exercises stretching for foot but has not done them lately - Denies redness, swelling, numbness tingling, injury or trauma or twist   Depression screen [facility] 2/9 07/31/2019 03/19/2019 07/16/2018  Decreased Interest 2 2 0  Down, Depressed, Hopeless 1 2 0  PHQ - 2 Score 3 4 0  Altered sleeping 2 3 -  Tired, decreased energy 2 2 -  Change in appetite 1 2 -  Feeling bad or failure about yourself  1 2 -  Trouble concentrating 1 1 -  Moving slowly or fidgety/restless 1 2 -  Suicidal thoughts 0 0 -  PHQ-9 Score 11 16 -  Difficult doing work/chores Somewhat difficult Somewhat difficult -    Social History   Tobacco Use  . Smoking status: Former Smoker    Packs/day: 0.50    Years: 10.00    Pack years: 5.00    Types: Cigarettes    Quit date: 2016    Years since quitting: 4.6  . Smokeless tobacco: Never Used  . Tobacco comment: pt quit smoking 10/2016  Substance Use Topics  . Alcohol use: Yes    Alcohol/week: 0.0 standard drinks    Comment: occasional - 6x/yr  . Drug use: No    Review of Systems Per HPI unless specifically indicated above     Objective:    BP (!) 136/91   Pulse 79   Temp 98.2 F (36.8 C) (Oral)   Resp 16    Ht 5\' 3"  (1.6 m)   Wt 240 lb (108.9 kg)   SpO2 97%   BMI 42.51 kg/m   Wt Readings from Last 3 Encounters:  08/06/19 240 lb (108.9 kg)  07/31/19 240 lb (108.9 kg)  07/16/18 216 lb (98 kg)    Physical Exam Vitals signs and nursing note reviewed.  Constitutional:      General: She is not in acute distress.    Appearance: She is well-developed. She is not diaphoretic.     Comments: Well-appearing, comfortable, cooperative, obese  HENT:     Head: Normocephalic and atraumatic.  Eyes:     General:        Right eye: No discharge.        Left eye: No discharge.     Conjunctiva/sclera: Conjunctivae normal.  Cardiovascular:     Rate and Rhythm: Normal rate.  Pulmonary:     Effort: Pulmonary effort is normal.  Musculoskeletal:     Comments: Left foot - tender to palpation medial arch and heel, and directly into heel, non tender no deformity of achilles, no forefoot pain or deformity, no edema  Skin:    General: Skin is warm and dry.  Findings: No erythema or rash.  Neurological:     Mental Status: She is alert and oriented to person, place, and time.  Psychiatric:        Behavior: Behavior normal.     Comments: Well groomed, good eye contact, normal speech and thoughts       Results for orders placed or performed in visit on 07/16/18  POCT glycosylated hemoglobin (Hb A1C)  Result Value Ref Range   Hemoglobin A1C 5.8 (A) 4.0 - 5.6 %   HbA1c POC (<> result, manual entry)  4.0 - 5.6 %   HbA1c, POC (prediabetic range)  5.7 - 6.4 %   HbA1c, POC (controlled diabetic range)  0.0 - 7.0 %      Assessment & Plan:   Problem List Items Addressed This Visit    None    Visit Diagnoses    Plantar fasciitis, left    -  Primary   Relevant Medications   naproxen (NAPROSYN) 500 MG tablet      Clinically consistent with subacute on chronic vs recurrent L plantar fasciitis based on history and exam, localized pain. Likely underlying etiology with prolonged standing repetitive work  -  No known injury. Unlikely fracture based on history, no bony tenderness. - Limited conservative therapy  - S/p plantar fascia surgery / injections in 2016 per [facility] Dr Charlsie Merlesegal  Plan: 1. Reviewed diagnosis and management of plantar fasciitis 2. Start rx NSAID trial - Naproxen 500mg  BID wc x 2-4 weeks then PRN 3. May take Tylenol PRN breakthrough 4. May use topical muscle rub, ice 5. Emphasized importance of relative rest, ice, and avoid prolonged standing and overuse 6. Handout given and explained appropriate stretching and home exercises important to do first thing in morning and later 7. Follow-up 4-6 weeks if not improved, consider return to Podiatry if need - gave her their number, can do repeat referral if needed   Meds ordered this encounter  Medications  . naproxen (NAPROSYN) 500 MG tablet    Sig: Take 1 tablet (500 mg total) by mouth 2 (two) times daily with a meal. For 2-4 weeks then as needed    Dispense:  60 tablet    Refill:  0     Follow up plan: Return in about 4 weeks (around 09/03/2019), or if symptoms worsen or fail to improve, for foot pain plantar fasciits.    Saralyn PilarAlexander , DO [facility] 08/06/2019, 3:15 PM

## 2019-08-06 NOTE — Patient Instructions (Addendum)
Thank you for coming to the office today.  You most likely have Plantar Fasciitis of heel / foot. - This is inflammation of the fibrous connection on the bottom of the foot, and can have small micro tears over time that become painful. It usually will have flare ups lasting days to weeks, and may come back after it heals if it is re-aggravated again. - Often there is a bone spur or arthritis of the heel bone that causes this - Also it may be caused by abnormal footwear, walking pattern or other problems  If you are experiencing an acute flare with pain, this is usually worst first thing in the morning when the plantar fascia is tight and stiff. First step out of bed is painful usually, and it may gradually improve with stretching and walking.  Recommend trial of Anti-inflammatory with Naproxen (Naprosyn) 500mg  tabs - take one with food and plenty of water TWICE daily every day (breakfast and dinner), for next 2 to 4 weeks, then you may take only as needed - DO NOT TAKE any ibuprofen, aleve, motrin while you are taking this medicine - It is safe to take Tylenol Ext Str 500mg  tabs - take 1 to 2 (max dose 1000mg ) every 6 hours as needed for breakthrough pain, max 24 hour daily dose is 6 to 8 tablets or 4000mg   Recommend: - Rest / relative rest with activity modification avoid overuse / prolonged stand - Ice packs (make sure you use a towel or sock / something to protect skin)  May also try topical muscle rub, icy hot, tiger balm  Start the exercises listed below, gradually increase them as instructed. May be sore at first and hopefully will stretch out and help reduce pain later.  If not improving after 1-2 weeks on medicine, and stretching exercises and some rest - then can contact our office again for re-evaluation or we can have you get back to Dr Charlsie Merles or the [facility] - you can call them if you need an apt as well  [facility] Address: [address] [place],  [place] [address] Hours: Open 8AM-5PM Phone: [phone]   Please schedule a Follow-up Appointment to: Return in about 4 weeks (around 09/03/2019), or if symptoms worsen or fail to improve, for foot pain plantar fasciits.  If you have any other questions or concerns, please feel free to call the office or send a message through MyChart. You may also schedule an earlier appointment if necessary.  Additionally, you may be receiving a survey about your experience at our office within a few days to 1 week by e-mail or mail. We value your feedback.  Saralyn [name], DO [facility], [facility]              Plantar Fascia Stretches / Exercises  See other page with pictures of each exercise.  Start with 1 or 2 of these exercises that you are most comfortable with. Do not do any exercises that cause you significant worsening pain. Some of these may cause some "stretching soreness" but it should go away after you stop the exercise, and get better over time. Gradually increase up to 3-4 exercises as tolerated.  You may begin exercising the muscles of your foot right away by gently stretching them as follows:  Stretching: Towel stretch: Sit on a hard surface with your injured leg stretched out in front of you. Loop a towel around the ball of your foot and pull the  towel toward your body keeping your knee straight. Hold this position for 15 to 30 seconds then relax. Repeat 3 times. When the towel stretch becomes to easy, you may begin doing the standing calf stretch.  Standing calf stretch: Facing a wall, put your hands against the wall at about eye level. Keep the injured leg back, the uninjured leg forward, and the heel of your injured leg on the floor. Turn your injured foot slightly inward (as if you were pigeon-toed) as you slowly lean into the wall until you feel a stretch in the back of your calf. Hold for 15 to 30 seconds. Repeat 3 times. Do this exercise several times each  day. When you can stand comfortably on your injured foot, you can begin stretching the bottom of your foot using the plantar fascia stretch.  Plantar fascia stretch: Stand with the ball of your injured foot on a stair. Reach for the bottom step with your heel until you feel a stretch in the arch of your foot. Hold this position for 15 to 30 seconds and then relax. Repeat 3 times. After you have stretched the bottom muscles of your foot, you can begin strengthening the top muscles of your foot.  Frozen can roll: Roll your bare injured foot back and forth from your heel to your mid-arch over a frozen juice can. Repeat for 3 to 5 minutes. This exercise is particularly helpful if done first thing in the morning. Towel pickup: With your heel on the ground, pick up a towel with your toes. Release. Repeat 10 to 20 times. When this gets easy, add more resistance by placing a book or small weight on the towel. Static and dynamic balance exercises Place a chair next to your non-injured leg and stand upright. (This will provide you with balance if needed.) Stand on your injured foot. Try to raise the arch of your foot while keeping your toes on the floor. Try to maintain this position and balance on your injured side for 30 seconds. This exercise can be made more difficult by doing it on a piece of foam or a pillow, or with your eyes closed. Stand in the same position as above. Keep your foot in this position and reach forward in front of you with your injured side's hand, allowing your knee to bend. Repeat this 10 times while maintaining the arch height. This exercise can be made more difficult by reaching farther in front of you. Do 2 sets. Stand in the same position as above. While maintaining your arch height, reach the injured side's hand across your body toward the chair. The farther you reach, the more challenging the exercise. Do 2 sets of 10.  Next, you can begin strengthening the muscles of your foot and  lower leg by using elastic tubing.  Strengthening: Resisted dorsiflexion: Sit with your injured leg out straight and your foot facing a doorway. Tie a loop in one end of the tubing. Put your foot through the loop so that the tubing goes around the arch of your foot. Tie a knot in the other end of the tubing and shut the knot in the door. Move backward until there is tension in the tubing. Keeping your knee straight, pull your foot toward your body, stretching the tubing. Slowly return to the starting position. Do 3 sets of 10. Resisted plantar flexion: Sit with your leg outstretched and loop the middle section of the tubing around the ball of your foot. Hold the ends of  the tubing in both hands. Gently press the ball of your foot down and point your toes, stretching the tubing. Return to the starting position. Do 3 sets of 10. Resisted inversion: Sit with your legs out straight and cross your uninjured leg over your injured ankle. Wrap the tubing around the ball of your injured foot and then loop it around your uninjured foot so that the tubing is anchored there at one end. Hold the other end of the tubing in your hand. Turn your injured foot inward and upward. This will stretch the tubing. Return to the starting position. Do 3 sets of 10. Resisted eversion: Sit with both legs stretched out in front of you, with your feet about a shoulder's width apart. Tie a loop in one end of the tubing. Put your injured foot through the loop so that the tubing goes around the arch of that foot and wraps around the outside of the uninjured foot. Hold onto the other end of the tubing with your hand to provide tension. Turn your injured foot up and out. Make sure you keep your uninjured foot still so that it will allow the tubing to stretch as you move your injured foot. Return to the starting position. Do 3 sets of 10.

## 2019-09-12 ENCOUNTER — Other Ambulatory Visit: Payer: Self-pay | Admitting: Family Medicine

## 2019-09-12 DIAGNOSIS — M722 Plantar fascial fibromatosis: Secondary | ICD-10-CM

## 2019-11-13 ENCOUNTER — Other Ambulatory Visit: Payer: Self-pay | Admitting: Family Medicine

## 2019-11-13 DIAGNOSIS — I10 Essential (primary) hypertension: Secondary | ICD-10-CM

## 2019-11-13 MED ORDER — VALSARTAN 80 MG PO TABS: 80.0000 mg | ORAL_TABLET | Freq: Every day | ORAL | 1 refills | Status: DC

## 2019-11-13 NOTE — Telephone Encounter (Signed)
Pt  Called requesting refill on valsartan

## 2019-11-27 ENCOUNTER — Other Ambulatory Visit: Payer: Self-pay | Admitting: Family Medicine

## 2019-11-27 DIAGNOSIS — M722 Plantar fascial fibromatosis: Secondary | ICD-10-CM

## 2019-11-27 MED ORDER — NAPROXEN 500 MG PO TABS: ORAL_TABLET | ORAL | 2 refills | Status: AC

## 2019-11-27 NOTE — Telephone Encounter (Signed)
Pt called requesting refill on  Naprosyn, valsartan called into  [facility]  [address]

## 2020-01-17 NOTE — Telephone Encounter (Signed)
ERROR

## 2020-02-07 ENCOUNTER — Other Ambulatory Visit: Payer: Self-pay | Admitting: Family Medicine

## 2020-02-07 DIAGNOSIS — I10 Essential (primary) hypertension: Secondary | ICD-10-CM

## 2020-04-10 ENCOUNTER — Other Ambulatory Visit: Payer: Self-pay

## 2020-04-10 ENCOUNTER — Emergency Department
Admission: EM | Admit: 2020-04-10 | Discharge: 2020-04-10 | Disposition: A | Discharge status: Home / Self Care | Payer: PRIVATE HEALTH INSURANCE | Attending: Emergency Medicine | Admitting: Emergency Medicine

## 2020-04-10 ENCOUNTER — Emergency Department: Payer: PRIVATE HEALTH INSURANCE

## 2020-04-10 ENCOUNTER — Encounter: Payer: Self-pay | Admitting: Emergency Medicine

## 2020-04-10 DIAGNOSIS — Y93I9 Activity, other involving external motion: Secondary | ICD-10-CM | POA: Insufficient documentation

## 2020-04-10 DIAGNOSIS — Z79899 Other long term (current) drug therapy: Secondary | ICD-10-CM | POA: Insufficient documentation

## 2020-04-10 DIAGNOSIS — Y92410 Unspecified street and highway as the place of occurrence of the external cause: Secondary | ICD-10-CM | POA: Insufficient documentation

## 2020-04-10 DIAGNOSIS — Z87891 Personal history of nicotine dependence: Secondary | ICD-10-CM | POA: Insufficient documentation

## 2020-04-10 DIAGNOSIS — Y999 Unspecified external cause status: Secondary | ICD-10-CM | POA: Insufficient documentation

## 2020-04-10 DIAGNOSIS — R0789 Other chest pain: Secondary | ICD-10-CM | POA: Diagnosis present

## 2020-04-10 LAB — BASIC METABOLIC PANEL
Anion gap: 7 (ref 5–15)
BUN: 22 mg/dL — ABNORMAL HIGH (ref 6–20)
CO2: 29 mmol/L (ref 22–32)
Calcium: 9.2 mg/dL (ref 8.9–10.3)
Chloride: 100 mmol/L (ref 98–111)
Creatinine, Ser: 1.13 mg/dL — ABNORMAL HIGH (ref 0.44–1.00)
GFR calc Af Amer: >60 mL/min (ref >60)
GFR calc non Af Amer: 54 mL/min — ABNORMAL LOW (ref >60)
Glucose, Bld: 106 mg/dL — ABNORMAL HIGH (ref 70–99)
Potassium: 3.5 mmol/L (ref 3.5–5.1)
Sodium: 136 mmol/L (ref 135–145)

## 2020-04-10 LAB — CBC
HCT: 37.3 % (ref 36.0–46.0)
Hemoglobin: 12.7 g/dL (ref 12.0–15.0)
MCH: 29.5 pg (ref 26.0–34.0)
MCHC: 34 g/dL (ref 30.0–36.0)
MCV: 86.5 fL (ref 80.0–100.0)
Platelets: 301 10*3/uL (ref 150–400)
RBC: 4.31 MIL/uL (ref 3.87–5.11)
RDW: 14.2 % (ref 11.5–15.5)
WBC: 10.4 10*3/uL (ref 4.0–10.5)
nRBC: 0 % (ref 0.0–0.2)

## 2020-04-10 LAB — TROPONIN I (HIGH SENSITIVITY): Troponin I (High Sensitivity): 5 ng/L (ref <18)

## 2020-04-10 MED ORDER — HYDROCODONE-ACETAMINOPHEN 5-325 MG PO TABS
1.0000 | ORAL_TABLET | Freq: Once | ORAL | Status: AC
  Administered 2020-04-10: 22:00:00 1 via ORAL
  Filled 2020-04-10: qty 1

## 2020-04-10 MED ORDER — ONDANSETRON 4 MG PO TBDP
4.0000 mg | ORAL_TABLET | Freq: Once | ORAL | Status: AC
  Administered 2020-04-10: 4 mg via ORAL
  Filled 2020-04-10: qty 1

## 2020-04-10 MED ORDER — BACITRACIN ZINC 500 UNIT/GM EX OINT
TOPICAL_OINTMENT | Freq: Once | CUTANEOUS | Status: AC
  Filled 2020-04-10: qty 0.9

## 2020-04-10 MED ORDER — HYDROCODONE-ACETAMINOPHEN 5-325 MG PO TABS: 1.0000 | ORAL_TABLET | ORAL | 0 refills | Status: DC | PRN
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
## 2020-04-10 NOTE — ED Notes (Signed)
Patient to x-ray and CT scan via stretcher.
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
## 2020-05-28 ENCOUNTER — Other Ambulatory Visit: Payer: Self-pay | Admitting: Family Medicine

## 2020-05-28 DIAGNOSIS — I10 Essential (primary) hypertension: Secondary | ICD-10-CM

## 2020-05-28 MED ORDER — HYDROCHLOROTHIAZIDE 25 MG PO TABS: 25.0000 mg | ORAL_TABLET | Freq: Every day | ORAL | 0 refills | Status: DC

## 2020-05-28 NOTE — Telephone Encounter (Signed)
Pt has reached out to her pharm. Pt needs a refill on hctz 25 mg. [facility] [address] in [place]. Pt last appt aug 2020

## 2020-05-28 NOTE — Telephone Encounter (Signed)
Attempted to contact patient to schedule f/u medication appointment- left message on VM to call office for appointment. Courtesy RF given #[phone]

## 2020-07-07 ENCOUNTER — Other Ambulatory Visit: Payer: Self-pay | Admitting: Family Medicine

## 2020-07-07 DIAGNOSIS — I10 Essential (primary) hypertension: Secondary | ICD-10-CM

## 2020-07-07 MED ORDER — VALSARTAN 80 MG PO TABS: 80.0000 mg | ORAL_TABLET | Freq: Every day | ORAL | 0 refills | Status: DC

## 2020-07-07 MED ORDER — HYDROCHLOROTHIAZIDE 25 MG PO TABS: 25.0000 mg | ORAL_TABLET | Freq: Every day | ORAL | 0 refills | Status: DC

## 2020-07-07 NOTE — Telephone Encounter (Signed)
Requested medication (s) are due for refill today: yes  Requested medication (s) are on the active medication list: yes  Last refill:  11/13/2019  Future visit scheduled: yes  Notes to clinic:  Patient has appointment tomorrow    Requested Prescriptions  Pending Prescriptions Disp Refills   valsartan (DIOVAN) 80 MG tablet 90 tablet 1    Sig: Take 1 tablet (80 mg total) by mouth daily.      Cardiovascular:  Angiotensin Receptor Blockers Failed - 07/07/2020  9:57 AM      Failed - Cr in normal range and within 180 days    Creat  Date Value Ref Range Status  04/09/2018 0.93 0.50 - 1.05 mg/dL Final    Comment:    For patients >86 years of age, the reference limit for Creatinine is approximately 13% higher for people identified as African-American. .    Creatinine, Ser  Date Value Ref Range Status  04/10/2020 1.13 (H) 0.44 - 1.00 mg/dL Final          Failed - Last BP in normal range    BP Readings from Last 1 Encounters:  04/10/20 (!) 168/88          Failed - Valid encounter within last 6 months    Recent Outpatient Visits           11 months ago Plantar fasciitis, left   [facility] Smitty Cords, DO   11 months ago Primary insomnia   [facility] [place], Netta Neat, DO   1 year ago Acute adjustment disorder with mixed anxiety and depressed mood   [facility] [place], Netta Neat, DO   1 year ago Elevated hemoglobin A1c   [facility] [place], Netta Neat, DO   2 years ago Encounter for Papanicolaou smear for cervical cancer screening   [facility] [place], Alison Stalling, NP       Future Appointments             Tomorrow Malfi, Jodelle Gross, FNP [facility]            Passed - K in normal range and within 180 days    Potassium  Date Value Ref Range Status  04/10/2020 3.5 3.5 - 5.1 mmol/L Final          Passed - Patient is not  pregnant        hydrochlorothiazide (HYDRODIURIL) 25 MG tablet 30 tablet 0    Sig: Take 1 tablet (25 mg total) by mouth daily.      Cardiovascular: Diuretics - Thiazide Failed - 07/07/2020  9:57 AM      Failed - Cr in normal range and within 360 days    Creat  Date Value Ref Range Status  04/09/2018 0.93 0.50 - 1.05 mg/dL Final    Comment:    For patients >69 years of age, the reference limit for Creatinine is approximately 13% higher for people identified as African-American. .    Creatinine, Ser  Date Value Ref Range Status  04/10/2020 1.13 (H) 0.44 - 1.00 mg/dL Final          Failed - Last BP in normal range    BP Readings from Last 1 Encounters:  04/10/20 (!) 168/88          Failed - Valid encounter within last 6 months    Recent Outpatient Visits  11 months ago Plantar fasciitis, left   [facility] Smitty Cords, [place]   11 months ago Primary insomnia   [facility] [place], Netta Neat, DO   1 year ago Acute adjustment disorder with mixed anxiety and depressed mood   [facility] [place], Netta Neat, DO   1 year ago Elevated hemoglobin A1c   [facility] [place], Netta Neat, DO   2 years ago Encounter for Papanicolaou smear for cervical cancer screening   [facility] [place], Alison Stalling, NP       Future Appointments             Tomorrow Malfi, Jodelle Gross, FNP [facility]            Passed - Ca in normal range and within 360 days    Calcium  Date Value Ref Range Status  04/10/2020 9.2 8.9 - 10.3 mg/dL Final          Passed - K in normal range and within 360 days    Potassium  Date Value Ref Range Status  04/10/2020 3.5 3.5 - 5.1 mmol/L Final          Passed - Na in normal range and within 360 days    Sodium  Date Value Ref Range Status  04/10/2020 136 135 - 145 mmol/L Final

## 2020-07-07 NOTE — Telephone Encounter (Signed)
Medication Refill - Medication: hydrochlorothiazide (HYDRODIURIL) 25 MG tablet ,valsartan (DIOVAN) 80 MG tablet   ,  Has the patient contacted their pharmacy?yes (Agent: If no, request that the patient contact the pharmacy for the refill.) (Agent: If yes, when and what did the pharmacy advise?)Contact PCP  Preferred Pharmacy (with phone number or street name):  [facility] [address] [place]), [place] - [address] Phone:  [phone]  Fax:  [phone]       Agent: Please be advised that RX refills may take up to 3 business days. We ask that you follow-up with your pharmacy.

## 2020-07-08 ENCOUNTER — Ambulatory Visit (INDEPENDENT_AMBULATORY_CARE_PROVIDER_SITE_OTHER): Payer: Self-pay | Admitting: Family Medicine

## 2020-07-08 ENCOUNTER — Encounter: Payer: Self-pay | Admitting: Family Medicine

## 2020-07-08 ENCOUNTER — Telehealth (INDEPENDENT_AMBULATORY_CARE_PROVIDER_SITE_OTHER): Payer: PRIVATE HEALTH INSURANCE | Admitting: Family Medicine

## 2020-07-08 ENCOUNTER — Other Ambulatory Visit: Payer: Self-pay

## 2020-07-08 ENCOUNTER — Other Ambulatory Visit: Payer: Self-pay | Admitting: Family Medicine

## 2020-07-08 VITALS — BP 138/84 | HR 70 | Ht 63.0 in | Wt 235.0 lb

## 2020-07-08 VITALS — BP 138/84 | HR 70 | Ht 63.0 in | Wt 235.8 lb

## 2020-07-08 DIAGNOSIS — R3129 Other microscopic hematuria: Secondary | ICD-10-CM | POA: Insufficient documentation

## 2020-07-08 DIAGNOSIS — R8290 Unspecified abnormal findings in urine: Secondary | ICD-10-CM

## 2020-07-08 DIAGNOSIS — Z024 Encounter for examination for driving license: Secondary | ICD-10-CM | POA: Insufficient documentation

## 2020-07-08 LAB — POCT URINALYSIS DIPSTICK
Bilirubin, UA: NEGATIVE
Glucose, UA: NEGATIVE
Ketones, UA: NEGATIVE
Leukocytes, UA: NEGATIVE
Nitrite, UA: NEGATIVE
Protein, UA: NEGATIVE
Spec Grav, UA: 1.01 (ref 1.010–1.025)
Urobilinogen, UA: 0.2 E.U./dL
pH, UA: 7 (ref 5.0–8.0)
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
## 2020-07-08 NOTE — Patient Instructions (Signed)
1 year card for DOT PE provided

## 2020-07-08 NOTE — Addendum Note (Signed)
Addended by: Lonna Cobb on: 07/08/2020 09:23 AM   Modules accepted: Orders

## 2020-07-08 NOTE — Progress Notes (Signed)
Subjective:    Patient ID: Candice Campbell, female    DOB: 03-May-1961, 59 y.o.   MRN: [id number]  Candice Campbell is a 59 y.o. 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  Depression screen [facility] 2/9 07/31/2019 03/19/2019 07/16/2018  Decreased Interest 2 2 0  Down, Depressed, Hopeless 1 2 0  PHQ - 2 Score 3 4 0  Altered sleeping 2 3 -  Tired, decreased energy 2 2 -  Change in appetite 1 2 -  Feeling bad or failure about yourself  1 2 -  Trouble concentrating 1 1 -  Moving slowly or fidgety/restless 1 2 -  Suicidal thoughts 0 0 -  PHQ-9 Score 11 16 -  Difficult doing work/chores Somewhat difficult Somewhat difficult -    Social History   Tobacco Use  . Smoking status: Former Smoker    Packs/day: 0.50    Years: 10.00    Pack years: 5.00    Types: Cigarettes    Quit date: 2016    Years since quitting: 5.5  . Smokeless tobacco: Never Used  . Tobacco comment: pt quit smoking 10/2016  Substance Use Topics  . Alcohol use: Yes    Alcohol/week: 0.0 standard drinks    Comment: occasional - 6x/yr  . Drug use: No    Review of Systems  Constitutional: Negative.   HENT: Negative.   Eyes: Negative.   Respiratory: Negative.   Cardiovascular: Negative.   Gastrointestinal: Negative.   Endocrine: Negative.   Genitourinary: Negative.   Musculoskeletal: Negative.   Skin: Negative.   Allergic/Immunologic: Negative.   Neurological: Negative.   Hematological: Negative.   Psychiatric/Behavioral: Negative.    Per HPI unless specifically indicated above     Objective:    BP (!) 138/84 (BP Location: Left Arm, Patient Position: Sitting, Cuff Size: Large)   Pulse 70   Ht 5\' 3"  (1.6 m)   Wt (!) 235 lb 12.8 oz (107 kg)   BMI 41.77 kg/m   Wt Readings from Last 3 Encounters:  07/08/20 (!) 235 lb (106.6 kg)  07/08/20 (!) 235 lb 12.8 oz (107 kg)  04/10/20 200 lb (90.7 kg)    Physical Exam Vitals reviewed.    Constitutional:      General: She is not in acute distress.    Appearance: Normal appearance. She is well-developed and well-groomed. She is morbidly obese. She is not ill-appearing or toxic-appearing.  HENT:     Head: Normocephalic and atraumatic.     Right Ear: Tympanic membrane, ear canal and external ear normal. There is no impacted cerumen.     Left Ear: Tympanic membrane, ear canal and external ear normal. There is no impacted cerumen.     Nose: Nose normal. No congestion or rhinorrhea.     Mouth/Throat:     Lips: Pink.     Mouth: Mucous membranes are moist.     Pharynx: Oropharynx is clear. Uvula midline. No oropharyngeal exudate or posterior oropharyngeal erythema.  Eyes:     General: Lids are normal. Vision grossly intact. No scleral icterus.       Right eye: No discharge.        Left eye: No discharge.     Extraocular Movements: Extraocular movements intact.     Conjunctiva/sclera: Conjunctivae normal.     Pupils: Pupils are equal, round, and reactive to light.  Neck:     Thyroid: No thyroid mass or thyromegaly.  Cardiovascular:  Rate and Rhythm: Normal rate and regular rhythm.     Pulses: Normal pulses.          Dorsalis pedis pulses are 2+ on the right side and 2+ on the left side.     Heart sounds: Normal heart sounds. No murmur heard.  No friction rub. No gallop.   Pulmonary:     Effort: Pulmonary effort is normal. No respiratory distress.     Breath sounds: Normal breath sounds.  Abdominal:     General: Abdomen is flat. Bowel sounds are normal. There is no distension.     Palpations: Abdomen is soft. There is no hepatomegaly, splenomegaly or mass.     Tenderness: There is no abdominal tenderness. There is no guarding or rebound.     Hernia: No hernia is present.  Musculoskeletal:        General: Normal range of motion.     Cervical back: Normal range of motion and neck supple. No tenderness.     Right lower leg: No edema.     Left lower leg: No edema.      Comments: 5/5 strength BUE & BLE.  Normal tone  Feet:     Right foot:     Skin integrity: Skin integrity normal.     Left foot:     Skin integrity: Skin integrity normal.  Lymphadenopathy:     Cervical: No cervical adenopathy.  Skin:    General: Skin is warm and dry.     Capillary Refill: Capillary refill takes less than 2 seconds.  Neurological:     General: No focal deficit present.     Mental Status: She is alert and oriented to person, place, and time.     Cranial Nerves: No cranial nerve deficit.     Sensory: No sensory deficit.     Motor: No weakness.     Coordination: Coordination normal.     Gait: Gait normal.     Deep Tendon Reflexes: Reflexes normal.  Psychiatric:        Attention and Perception: Attention and perception normal.        Mood and Affect: Mood and affect normal.        Speech: Speech normal.        Behavior: Behavior normal. Behavior is cooperative.        Thought Content: Thought content normal.        Cognition and Memory: Cognition and memory normal.        Judgment: Judgment normal.    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     Follow up plan: Return in about 1 year (around 07/08/2021) for DOT  PE.   Charlaine Dalton, FNP Family Nurse Practitioner [facility] 07/09/2020, 2:07 PM

## 2020-07-08 NOTE — Assessment & Plan Note (Signed)
1 year DOT Certificate provided

## 2020-07-08 NOTE — Assessment & Plan Note (Signed)
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  Patient has 5 year pack history of smoking cigarettes with quit date of 10/2016.  No urinary symptoms present and no gross hematuria noted.  Plan: 1. Urine sent to lab for microscopy

## 2020-07-09 LAB — URINALYSIS, MICROSCOPIC ONLY
Bacteria, UA: NONE SEEN /HPF
Hyaline Cast: NONE SEEN /LPF
RBC / HPF: NONE SEEN /HPF (ref 0–2)
Squamous Epithelial / HPF: NONE SEEN /HPF (ref <=5)
WBC, UA: NONE SEEN /HPF (ref 0–5)

## 2020-10-20 ENCOUNTER — Encounter: Payer: Self-pay | Admitting: Family Medicine

## 2020-10-20 ENCOUNTER — Ambulatory Visit (INDEPENDENT_AMBULATORY_CARE_PROVIDER_SITE_OTHER): Payer: PRIVATE HEALTH INSURANCE | Admitting: Family Medicine

## 2020-10-20 ENCOUNTER — Other Ambulatory Visit: Payer: Self-pay

## 2020-10-20 ENCOUNTER — Telehealth: Payer: Self-pay | Admitting: Obstetrics & Gynecology

## 2020-10-20 VITALS — BP 133/82 | HR 59 | Temp 97.3°F | Resp 16 | Ht 63.0 in | Wt 236.0 lb

## 2020-10-20 DIAGNOSIS — Z0000 Encounter for general adult medical examination without abnormal findings: Secondary | ICD-10-CM | POA: Diagnosis not present

## 2020-10-20 DIAGNOSIS — I10 Essential (primary) hypertension: Secondary | ICD-10-CM | POA: Diagnosis not present

## 2020-10-20 DIAGNOSIS — F410 Panic disorder [episodic paroxysmal anxiety] without agoraphobia: Secondary | ICD-10-CM

## 2020-10-20 DIAGNOSIS — F411 Generalized anxiety disorder: Secondary | ICD-10-CM

## 2020-10-20 DIAGNOSIS — Z6841 Body Mass Index (BMI) 40.0 and over, adult: Secondary | ICD-10-CM

## 2020-10-20 DIAGNOSIS — R7309 Other abnormal glucose: Secondary | ICD-10-CM

## 2020-10-20 DIAGNOSIS — N951 Menopausal and female climacteric states: Secondary | ICD-10-CM

## 2020-10-20 DIAGNOSIS — Z1231 Encounter for screening mammogram for malignant neoplasm of breast: Secondary | ICD-10-CM

## 2020-10-20 DIAGNOSIS — R7303 Prediabetes: Secondary | ICD-10-CM

## 2020-10-20 MED ORDER — SAXENDA 18 MG/3ML ~~LOC~~ SOPN: PEN_INJECTOR | SUBCUTANEOUS | 0 refills | Status: DC

## 2020-10-20 MED ORDER — VALSARTAN 80 MG PO TABS: 80.0000 mg | ORAL_TABLET | Freq: Every day | ORAL | 1 refills | Status: DC

## 2020-10-20 MED ORDER — HYDROCHLOROTHIAZIDE 25 MG PO TABS: 25.0000 mg | ORAL_TABLET | Freq: Every day | ORAL | 1 refills | Status: DC

## 2020-10-20 MED ORDER — VENLAFAXINE HCL ER 37.5 MG PO CP24: 37.5000 mg | ORAL_CAPSULE | Freq: Every day | ORAL | 2 refills | Status: DC
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
## 2020-10-20 NOTE — Telephone Encounter (Signed)
Candice Campbell medical referring for Hot flashes due to menopause, morbid obesity with BMI of 40.0-44.9, adult (HCC). Called and left voicemail for patient to call back to be scheduled.

## 2020-10-20 NOTE — Patient Instructions (Addendum)
Thank you for coming to the office today.  For Mammogram screening for breast cancer   Call the Imaging Center below anytime to schedule your own appointment now that order has been placed.  [facility] [address] [place], [place] [address] Phone: [phone]  Saxenda sample  Initial: 0.6 mg once daily for 1 week; increase by 0.6 mg daily at weekly intervals to a target dose of 3 mg once daily. If the patient cannot tolerate an increased dose during dose escalation, consider delaying dose escalation for 1 additional week  Start Venlafaxine (effexor) 37.5mg  for mood and hot flashes.   Referred to GYN specialist for women's health to discuss Hot flashes as well and possible hormonal causes of weight gain.    Please schedule a Follow-up Appointment to: Return in about 4 months (around 02/17/2021) for 4 month follow-up PreDM A1c, weight med, hot flashes.  If you have any other questions or concerns, please feel free to call the office or send a message through MyChart. You may also schedule an earlier appointment if necessary.  Additionally, you may be receiving a survey about your experience at our office within a few days to 1 week by e-mail or mail. We value your feedback.  Saralyn [name], DO [facility], [place]

## 2020-10-21 LAB — COMPLETE METABOLIC PANEL WITH GFR
AG Ratio: 1.2 (calc) (ref 1.0–2.5)
ALT: 10 U/L (ref 6–29)
AST: 14 U/L (ref 10–35)
Albumin: 3.7 g/dL (ref 3.6–5.1)
Alkaline phosphatase (APISO): 47 U/L (ref 37–153)
BUN: 12 mg/dL (ref 7–25)
CO2: 28 mmol/L (ref 20–32)
Calcium: 9.4 mg/dL (ref 8.6–10.4)
Chloride: 103 mmol/L (ref 98–110)
Creat: 0.8 mg/dL (ref 0.50–1.05)
GFR, Est African American: 94 mL/min/{1.73_m2} (ref >=60)
GFR, Est Non African American: 81 mL/min/{1.73_m2} (ref >=60)
Globulin: 3 g/dL (calc) (ref 1.9–3.7)
Glucose, Bld: 84 mg/dL (ref 65–99)
Potassium: 4.1 mmol/L (ref 3.5–5.3)
Sodium: 137 mmol/L (ref 135–146)
Total Bilirubin: 0.3 mg/dL (ref 0.2–1.2)
Total Protein: 6.7 g/dL (ref 6.1–8.1)

## 2020-10-21 LAB — CBC WITH DIFFERENTIAL/PLATELET
Absolute Monocytes: 387 cells/uL (ref 200–950)
Basophils Absolute: 73 cells/uL (ref 0–200)
Basophils Relative: 1 %
Eosinophils Absolute: 241 cells/uL (ref 15–500)
Eosinophils Relative: 3.3 %
HCT: 37 % (ref 35.0–45.0)
Hemoglobin: 12.1 g/dL (ref 11.7–15.5)
Lymphs Abs: 2475 cells/uL (ref 850–3900)
MCH: 29.2 pg (ref 27.0–33.0)
MCHC: 32.7 g/dL (ref 32.0–36.0)
MCV: 89.2 fL (ref 80.0–100.0)
MPV: 11.5 fL (ref 7.5–12.5)
Monocytes Relative: 5.3 %
Neutro Abs: 4125 cells/uL (ref 1500–7800)
Neutrophils Relative %: 56.5 %
Platelets: 312 10*3/uL (ref 140–400)
RBC: 4.15 10*6/uL (ref 3.80–5.10)
RDW: 13.5 % (ref 11.0–15.0)
Total Lymphocyte: 33.9 %
WBC: 7.3 10*3/uL (ref 3.8–10.8)

## 2020-10-21 LAB — HEMOGLOBIN A1C
Hgb A1c MFr Bld: 6 % of total Hgb — ABNORMAL HIGH (ref <5.7)
Mean Plasma Glucose: 126 (calc)
eAG (mmol/L): 7 (calc)

## 2020-10-21 LAB — LIPID PANEL
Cholesterol: 182 mg/dL (ref <200)
HDL: 52 mg/dL (ref >=50)
LDL Cholesterol (Calc): 110 mg/dL (calc) — ABNORMAL HIGH
Non-HDL Cholesterol (Calc): 130 mg/dL (calc) — ABNORMAL HIGH (ref <130)
Total CHOL/HDL Ratio: 3.5 (calc) (ref <5.0)
Triglycerides: 98 mg/dL (ref <150)

## 2020-10-21 LAB — TSH: TSH: 0.97 mIU/L (ref 0.40–4.50)

## 2020-10-22 NOTE — Telephone Encounter (Signed)
Called and left voicemail for patient to call back to be scheduled. 

## 2020-10-26 NOTE — Telephone Encounter (Signed)
Called and left voicemail for patient to call back to be scheduled. 

## 2020-10-27 ENCOUNTER — Telehealth: Payer: Self-pay | Admitting: Family Medicine

## 2020-10-27 MED ORDER — SAXENDA 18 MG/3ML ~~LOC~~ SOPN: PEN_INJECTOR | SUBCUTANEOUS | 2 refills | Status: DC

## 2020-10-27 NOTE — Telephone Encounter (Signed)
She was given sample Saxenda on 11/9 - the pen should last for up to 3 weeks.  Please let her know that I have just submitted the rx Saxenda to her pharmacy [facility].  Information is copied below and in her last note for Prior Authorization.  Let her know that it is too early for another sample, we will try to get it approved, and if any issues with authorization we can contact her and may be able to figure out a sample again if we need to within 1-2 more weeks.  ____________________________________________________ Additional Rx Information For future rx when sent (May be used for Prior Authorization if required)  Medication name and Strength: Saxenda 3mg   1. Primary Diagnosis and ICD10 Code: Morbid Obesity BMI >41(E66.01) 2. Secondary Diagnosis and ICD10 Code: Prediabetes (R73.03) 3. Previous Failed Medications (Duration or Start Date) a. No prior failed medication 4. Quantity and Duration of New Medication: Saxenda 1 pen 38mL for 30 days 5. Additional Supporting Information: a. Lab result A1c 5.8 (07/16/18) due for repeat b. Cholesterol panel LDL 113 (04/09/18) c. Tried Weight Watchers, diet / exercise regimen. ____________________________________________________   04/11/18, DO [facility] 10/27/2020, 12:35 PM

## 2020-10-27 NOTE — Telephone Encounter (Signed)
Patient advised she is aware that she can't get another sample too soon.

## 2020-10-27 NOTE — Telephone Encounter (Signed)
Relation to pt: self  Call back number: [phone]    Reason for call:  Patient would like to continue taking saxenda and was advised to inform PCP to process rx through her insurance. Patient would like to know if PCP has any more samples, please advise

## 2020-10-28 ENCOUNTER — Other Ambulatory Visit: Payer: Self-pay | Admitting: Family Medicine

## 2020-10-28 DIAGNOSIS — I10 Essential (primary) hypertension: Secondary | ICD-10-CM

## 2020-11-03 NOTE — Telephone Encounter (Signed)
Called and left voicemail for patient to call back to be scheduled. 

## 2020-11-09 NOTE — Telephone Encounter (Signed)
Pt calling and is requesting to have an update on this medication and is requesting to know if this can be covered by insurance. Pt states that she is out and is requesting to have a sample if not covered by insurance. Please advise.

## 2020-11-10 NOTE — Telephone Encounter (Signed)
Called patient. Advised her of denial from PA.  We agree since she has had success so far on initial sample Saxenda up to dose 1.2 she has had 12 lb wt loss already and improving, reduced appetite.  Trial one more sample pen Saxenda. She will pick up today. Advised she can check insurance cost coverage and formulary of other preferred options instead and follow up with me.  We can also always try ordering GLP1 such as Ozemipc or Trulicity or Victoza for Pre-Diabetes and Morbid Obesity diagnosis if need in future to see if covered  Saralyn [name], DO [facility] 11/10/2020, 11:52 AM

## 2020-11-10 NOTE — Telephone Encounter (Signed)
Candice Campbell (Key: [id number])  Rx #: [id number]  Saxenda 18MG pen-injectors the PA has been denied.

## 2020-12-23 ENCOUNTER — Encounter: Payer: Self-pay | Admitting: Family Medicine

## 2020-12-23 ENCOUNTER — Telehealth (INDEPENDENT_AMBULATORY_CARE_PROVIDER_SITE_OTHER): Payer: PRIVATE HEALTH INSURANCE | Admitting: Family Medicine

## 2020-12-23 ENCOUNTER — Other Ambulatory Visit: Payer: Self-pay

## 2020-12-23 DIAGNOSIS — R7303 Prediabetes: Secondary | ICD-10-CM

## 2020-12-23 MED ORDER — WEGOVY 0.25 MG/0.5ML ~~LOC~~ SOAJ: 0.2500 mg | SUBCUTANEOUS | 0 refills | Status: DC

## 2020-12-23 NOTE — Patient Instructions (Addendum)
Stop Saxenda START TSVXBL 0.25mg  weekly injection, 4 pen = 4 weeks in sample box Check into [employer] Copay Card program - in the box Let me know in 2-3 weeks message or call if ready to start higher dose, we can order 0.5mg  dose to pharmacy, try to get it covered, may need authorization , and youll likely need the coupon copay card activated  Dose can go up every 4 weeks, up to max of 2.4mg  after months. Then that is all they cover per 365 days.  Pick up sample here tomorrow.  Please schedule a Follow-up Appointment to: Return in about 3 months (around 03/23/2021) for 3 month f/u weight management.  If you have any other questions or concerns, please feel free to call the office or send a message through MyChart. You may also schedule an earlier appointment if necessary.  Additionally, you may be receiving a survey about your experience at our office within a few days to 1 week by e-mail or mail. We value your feedback.  Saralyn [name], DO [facility], [place]
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
## 2020-12-31 ENCOUNTER — Other Ambulatory Visit: Payer: Self-pay

## 2020-12-31 ENCOUNTER — Ambulatory Visit
Admission: RE | Admit: 2020-12-31 | Discharge: 2020-12-31 | Disposition: A | Discharge status: Home / Self Care | Payer: BLUE CROSS/BLUE SHIELD | Source: Ambulatory Visit | Attending: Family Medicine | Admitting: Family Medicine

## 2020-12-31 DIAGNOSIS — Z1231 Encounter for screening mammogram for malignant neoplasm of breast: Secondary | ICD-10-CM | POA: Diagnosis present

## 2021-04-28 ENCOUNTER — Telehealth: Payer: Self-pay | Admitting: Family Medicine

## 2021-04-28 NOTE — Telephone Encounter (Signed)
Patient would like a callback regarding medication. Patient would not give any more details as to which medication.   CB: [phone]

## 2021-04-29 NOTE — Telephone Encounter (Signed)
Attempted to call the patient to get more information but there was no answer and her VM was full. Will attempt at a later time.

## 2021-05-25 ENCOUNTER — Other Ambulatory Visit: Payer: Self-pay | Admitting: Family Medicine

## 2021-05-25 DIAGNOSIS — N951 Menopausal and female climacteric states: Secondary | ICD-10-CM

## 2021-05-25 DIAGNOSIS — Z6841 Body Mass Index (BMI) 40.0 and over, adult: Secondary | ICD-10-CM

## 2021-05-25 DIAGNOSIS — I10 Essential (primary) hypertension: Secondary | ICD-10-CM

## 2021-05-25 MED ORDER — VALSARTAN 80 MG PO TABS: 80.0000 mg | ORAL_TABLET | Freq: Every day | ORAL | 0 refills | Status: DC

## 2021-05-25 MED ORDER — VENLAFAXINE HCL ER 37.5 MG PO CP24: 37.5000 mg | ORAL_CAPSULE | Freq: Every day | ORAL | 2 refills | Status: DC

## 2021-05-25 MED ORDER — HYDROCHLOROTHIAZIDE 25 MG PO TABS: 25.0000 mg | ORAL_TABLET | Freq: Every day | ORAL | 0 refills | Status: DC

## 2021-05-25 NOTE — Telephone Encounter (Signed)
Copied from CRM [id number]. Topic: Quick Communication - Rx Refill/Question >> May 25, 2021  9:39 AM Jaquita Rector A wrote: Medication: hydrochlorothiazide (HYDRODIURIL) 25 MG tablet, valsartan (DIOVAN) 80 MG tablet, venlafaxine XR (EFFEXOR XR) 37.5 MG 24 hr capsule   Has the patient contacted their pharmacy? Yes.   (Agent: If no, request that the patient contact the pharmacy for the refill.) (Agent: If yes, when and what did the pharmacy advise?)  Preferred Pharmacy (with phone number or street name): [facility] [address] [place]), [place] - [address]  Phone:  [phone] Fax:  [phone]     Agent: Please be advised that RX refills may take up to 3 business days. We ask that you follow-up with your pharmacy.

## 2021-09-17 ENCOUNTER — Other Ambulatory Visit: Payer: Self-pay | Admitting: Family Medicine

## 2021-09-17 DIAGNOSIS — I10 Essential (primary) hypertension: Secondary | ICD-10-CM

## 2021-09-17 NOTE — Telephone Encounter (Signed)
Requested medications are due for refill today yes  Requested medications are on the active medication list yes  Last refill 06/08/21  Last visit 12/23/20, was asked to return in 3 months and has not.  Future visit scheduled no  Notes to clinic Failed protocol due to no valid visit within 6  months.

## 2021-09-28 ENCOUNTER — Other Ambulatory Visit: Payer: Self-pay | Admitting: Family Medicine

## 2021-09-28 DIAGNOSIS — I10 Essential (primary) hypertension: Secondary | ICD-10-CM

## 2022-01-11 ENCOUNTER — Telehealth: Payer: Self-pay | Admitting: Family Medicine

## 2022-01-11 DIAGNOSIS — I10 Essential (primary) hypertension: Secondary | ICD-10-CM

## 2022-01-11 NOTE — Telephone Encounter (Signed)
Requested medication (s) are due for refill today: yes  Requested medication (s) are on the active medication list: yes  Last refill:  09/20/21  Future visit scheduled: no  Notes to clinic:  Failed protocol of visit and labs within date, labs 10/20/2020, please assess.  Requested Prescriptions  Pending Prescriptions Disp Refills   hydrochlorothiazide (HYDRODIURIL) 25 MG tablet [Pharmacy Med Name: hydroCHLOROthiazide 25 MG Oral Tablet] 90 tablet 0    Sig: Take 1 tablet by mouth once daily     Cardiovascular: Diuretics - Thiazide Failed - 01/11/2022  1:25 PM      Failed - Cr in normal range and within 180 days    Creat  Date Value Ref Range Status  10/20/2020 0.80 0.50 - 1.05 mg/dL Final    Comment:    For patients >41 years of age, the reference limit for Creatinine is approximately 13% higher for people identified as African-American. .           Failed - K in normal range and within 180 days    Potassium  Date Value Ref Range Status  10/20/2020 4.1 3.5 - 5.3 mmol/L Final          Failed - Na in normal range and within 180 days    Sodium  Date Value Ref Range Status  10/20/2020 137 135 - 146 mmol/L Final          Failed - Valid encounter within last 6 months    Recent Outpatient Visits           1 year ago Morbid obesity (HCC)   [facility] Althea Charon, Netta Neat, DO   1 year ago Annual physical exam   [facility] Smitty Cords, DO   1 year ago Abnormal urinalysis   [facility], Jodelle Gross, FNP   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   [facility], Jodelle Gross, FNP   2 years ago Plantar fasciitis, left   [facility], Netta Neat, DO              Passed - Last BP in normal range    BP Readings from Last 1 Encounters:  10/20/20 133/82           valsartan (DIOVAN) 80 MG tablet [Pharmacy Med Name: Valsartan 80 MG Oral  Tablet] 90 tablet 0    Sig: Take 1 tablet by mouth once daily     Cardiovascular:  Angiotensin Receptor Blockers Failed - 01/11/2022  1:25 PM      Failed - Cr in normal range and within 180 days    Creat  Date Value Ref Range Status  10/20/2020 0.80 0.50 - 1.05 mg/dL Final    Comment:    For patients >33 years of age, the reference limit for Creatinine is approximately 13% higher for people identified as African-American. .           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

## 2022-01-14 MED ORDER — VALSARTAN 80 MG PO TABS: 80.0000 mg | ORAL_TABLET | Freq: Every day | ORAL | 1 refills | Status: DC

## 2022-01-14 MED ORDER — HYDROCHLOROTHIAZIDE 25 MG PO TABS: 25.0000 mg | ORAL_TABLET | Freq: Every day | ORAL | 1 refills | Status: DC

## 2022-01-14 NOTE — Addendum Note (Signed)
Addended by: Smitty Cords on: 01/14/2022 03:58 PM   Modules accepted: Orders

## 2022-01-14 NOTE — Telephone Encounter (Signed)
Pt is calling to check on the status of her medication refill. Apt schedule 01/19/22. Pt took her last pill today.

## 2022-01-14 NOTE — Telephone Encounter (Signed)
Okay was not aware she made the appointment. I just sent re orders. Thanks  Saralyn [name], DO [facility] 01/14/2022, 3:58 PM

## 2022-01-19 ENCOUNTER — Ambulatory Visit (INDEPENDENT_AMBULATORY_CARE_PROVIDER_SITE_OTHER): Payer: Self-pay | Admitting: Family Medicine

## 2022-01-19 ENCOUNTER — Other Ambulatory Visit: Payer: Self-pay

## 2022-01-19 ENCOUNTER — Encounter: Payer: Self-pay | Admitting: Family Medicine

## 2022-01-19 VITALS — BP 127/81 | HR 72 | Ht 63.0 in | Wt 243.0 lb

## 2022-01-19 DIAGNOSIS — I10 Essential (primary) hypertension: Secondary | ICD-10-CM

## 2022-01-19 MED ORDER — PHENTERMINE HCL 37.5 MG PO TABS: 37.5000 mg | ORAL_TABLET | Freq: Every day | ORAL | 2 refills | Status: DC

## 2022-01-19 NOTE — Patient Instructions (Addendum)
Thank you for coming to the office today.  Try the Phentermine      Sig: Take 1 tablet (37.5 mg total) by mouth daily before breakfast.   WEIGHT MANAGEMENT  Dr Quillian Quince  [facility] [address] [place], [place] [address] Ph: [phone]  ----------------  Weight Loss referral [facility] [address] [place], [place] [address] Hours: 8am - 5pm Main: [phone]  -----------  For headache, would mix and match more, add Tylenol as needed and try Excedrin as well.   Please schedule a Follow-up Appointment to: Return in about 3 months (around 04/18/2022) for 3 month follow-up Weight Med Refill / HTN.  If you have any other questions or concerns, please feel free to call the office or send a message through MyChart. You may also schedule an earlier appointment if necessary.  Additionally, you may be receiving a survey about your experience at our office within a few days to 1 week by e-mail or mail. We value your feedback.  Saralyn [name], DO [facility], [place]

## 2022-01-19 NOTE — Progress Notes (Signed)
Subjective:    Patient ID: Candice Campbell, female    DOB: 08-18-1961, 61 y.o.   MRN: [id number]  Candice Campbell is a 61 y.o. female presenting on 01/19/2022 for Headache   HPI  Insurance changed in 2023, no longer covered here at [facility], today she would be self pay  Morbid Obesity BMI >43 Pre-Diabetes Last A1c 6.0 (10/2020) Improved weight loss 236 lb down to 223 with Saxenda previously using GLP1 however insurance denial, she used sample trial and improve lifestyle Interval update, weight gain again. Still difficulty with losing weight. She does cardio/exercise regularly 3 x weekly Diet: She has improved to healthy diet avoiding carbs, high protein, and drinks water  Headache, chronic Seems to have persistent headache, frontal bilateral Taking Ibuprofen 200mg  x 4 = 800mg  PRN 3 days a week, occasional Tylenol. Not taking much caffeine other than 1 cup coffee per day, no sodas  CHRONIC HTN: Current Meds - valsartan 80mg , HCTZ 25mg  daily recently refilled Reports good compliance, took meds today. Tolerating well, w/o complaints.    Depression screen [facility] 2/9 01/19/2022 10/20/2020 07/31/2019  Decreased Interest 1 1 2   Down, Depressed, Hopeless 0 0 1  PHQ - 2 Score 1 1 3   Altered sleeping 2 2 2   Tired, decreased energy 1 1 2   Change in appetite 1 1 1   Feeling bad or failure about yourself  1 0 1  Trouble concentrating 1 0 1  Moving slowly or fidgety/restless 0 0 1  Suicidal thoughts 0 0 0  PHQ-9 Score 7 5 11   Difficult doing work/chores Somewhat difficult Not difficult at all Somewhat difficult    Social History   Tobacco Use   Smoking status: Former    Packs/day: 0.50    Years: 10.00    Pack years: 5.00    Types: Cigarettes    Quit date: 2016    Years since quitting: 7.1   Smokeless tobacco: Never   Tobacco comments:    pt quit smoking 10/2016  Substance Use Topics   Alcohol use: Not Currently    Alcohol/week: 0.0 standard drinks    Comment: occasional - 6x/yr   Drug  use: No    Review of Systems Per HPI unless specifically indicated above     Objective:    BP 127/81    Pulse 72    Ht 5\' 3"  (1.6 m)    Wt 243 lb (110.2 kg)    SpO2 97%    BMI 43.05 kg/m   Wt Readings from Last 3 Encounters:  01/19/22 243 lb (110.2 kg)  12/23/20 223 lb (101.2 kg)  10/20/20 236 lb (107 kg)    Physical Exam Vitals and nursing note reviewed.  Constitutional:      General: She is not in acute distress.    Appearance: She is well-developed. She is obese. She is not diaphoretic.     Comments: Well-appearing, comfortable, cooperative  HENT:     Head: Normocephalic and atraumatic.  Eyes:     General:        Right eye: No discharge.        Left eye: No discharge.     Conjunctiva/sclera: Conjunctivae normal.  Neck:     Thyroid: No thyromegaly.  Cardiovascular:     Rate and Rhythm: Normal rate and regular rhythm.     Heart sounds: Normal heart sounds. No murmur heard. Pulmonary:     Effort: Pulmonary effort is normal. No respiratory distress.     Breath sounds: Normal breath  sounds. No wheezing or rales.  Musculoskeletal:        General: Normal range of motion.     Cervical back: Normal range of motion and neck supple.  Lymphadenopathy:     Cervical: No cervical adenopathy.  Skin:    General: Skin is warm and dry.     Findings: No erythema or rash.  Neurological:     Mental Status: She is alert and oriented to person, place, and time.  Psychiatric:        Behavior: Behavior normal.     Comments: Well groomed, good eye contact, normal speech and thoughts      Results for orders placed or performed in visit on 10/20/20  Hemoglobin A1c  Result Value Ref Range   Hgb A1c MFr Bld 6.0 (H) <5.7 % of total Hgb   Mean Plasma Glucose 126 (calc)   eAG (mmol/L) 7.0 (calc)  CBC with Differential/Platelet  Result Value Ref Range   WBC 7.3 3.8 - 10.8 Thousand/uL   RBC 4.15 3.80 - 5.10 Million/uL   Hemoglobin 12.1 11.7 - 15.5 g/dL   HCT 85.4 62.7 - 03.5 %   MCV  89.2 80.0 - 100.0 fL   MCH 29.2 27.0 - 33.0 pg   MCHC 32.7 32.0 - 36.0 g/dL   RDW 00.9 38.1 - 82.9 %   Platelets 312 140 - 400 Thousand/uL   MPV 11.5 7.5 - 12.5 fL   Neutro Abs 4,125 1,500 - 7,800 cells/uL   Lymphs Abs 2,475 850 - 3,900 cells/uL   Absolute Monocytes 387 200 - 950 cells/uL   Eosinophils Absolute 241 15 - 500 cells/uL   Basophils Absolute 73 0 - 200 cells/uL   Neutrophils Relative % 56.5 %   Total Lymphocyte 33.9 %   Monocytes Relative 5.3 %   Eosinophils Relative 3.3 %   Basophils Relative 1.0 %  COMPLETE METABOLIC PANEL WITH GFR  Result Value Ref Range   Glucose, Bld 84 65 - 99 mg/dL   BUN 12 7 - 25 mg/dL   Creat 9.37 1.69 - 6.78 mg/dL   GFR, Est Non African American 81 > OR = 60 mL/min/1.108m2   GFR, Est African American 94 > OR = 60 mL/min/1.20m2   BUN/Creatinine Ratio NOT APPLICABLE 6 - 22 (calc)   Sodium 137 135 - 146 mmol/L   Potassium 4.1 3.5 - 5.3 mmol/L   Chloride 103 98 - 110 mmol/L   CO2 28 20 - 32 mmol/L   Calcium 9.4 8.6 - 10.4 mg/dL   Total Protein 6.7 6.1 - 8.1 g/dL   Albumin 3.7 3.6 - 5.1 g/dL   Globulin 3.0 1.9 - 3.7 g/dL (calc)   AG Ratio 1.2 1.0 - 2.5 (calc)   Total Bilirubin 0.3 0.2 - 1.2 mg/dL   Alkaline phosphatase (APISO) 47 37 - 153 U/L   AST 14 10 - 35 U/L   ALT 10 6 - 29 U/L  Lipid panel  Result Value Ref Range   Cholesterol 182 <200 mg/dL   HDL 52 > OR = 50 mg/dL   Triglycerides 98 <938 mg/dL   LDL Cholesterol (Calc) 110 (H) mg/dL (calc)   Total CHOL/HDL Ratio 3.5 <5.0 (calc)   Non-HDL Cholesterol (Calc) 130 (H) <130 mg/dL (calc)  TSH  Result Value Ref Range   TSH 0.97 0.40 - 4.50 mIU/L      Assessment & Plan:   Problem List Items Addressed This Visit     Morbid obesity (HCC)   Relevant Medications  phentermine (ADIPEX-P) 37.5 MG tablet   Essential hypertension - Primary    Morbid Obesity Limited options at this point, since has exhausted lifestyle modification diet exercise, unable to pursue referrals today given  insurance network, she would consider nutrition / wt management options - improved on GLP1 but can no longer get covered / cost - Discussed remaining options - we agree to trial of Phentermine, short term course 3 month to start to see if help as a boost to lose weight. We discussed cardiovascular risk concerns related to BP and other potential issues. She is interested to try it, will use goodrx for cost, and she will be aware of potential side effects and discontinue if problem  HTN Stable, controlled Continue meds, recently refilled.  Meds ordered this encounter  Medications   phentermine (ADIPEX-P) 37.5 MG tablet    Sig: Take 1 tablet (37.5 mg total) by mouth daily before breakfast.    Dispense:  30 tablet    Refill:  2     Follow up plan: Return in about 3 months (around 04/18/2022) for 3 month follow-up Weight Med Refill / HTN.  Saralyn [name], DO [facility] 01/19/2022, 1:50 PM

## 2022-04-19 ENCOUNTER — Encounter: Payer: Self-pay | Admitting: Family Medicine

## 2022-04-19 ENCOUNTER — Telehealth: Payer: Self-pay

## 2022-04-19 ENCOUNTER — Ambulatory Visit: Payer: Self-pay | Admitting: Family Medicine

## 2022-04-19 DIAGNOSIS — I10 Essential (primary) hypertension: Secondary | ICD-10-CM

## 2022-04-19 MED ORDER — QSYMIA 3.75-23 MG PO CP24: 1.0000 | ORAL_CAPSULE | Freq: Every day | ORAL | 0 refills | Status: DC

## 2022-04-19 MED ORDER — HYDROCHLOROTHIAZIDE 25 MG PO TABS: 25.0000 mg | ORAL_TABLET | Freq: Every day | ORAL | 3 refills | Status: DC

## 2022-04-19 MED ORDER — PHENTERMINE HCL 37.5 MG PO CAPS
37.5000 mg | ORAL_CAPSULE | ORAL | 5 refills | Status: DC
Start: 2022-04-19 — End: 2022-06-08

## 2022-04-19 MED ORDER — VALSARTAN 80 MG PO TABS: 80.0000 mg | ORAL_TABLET | Freq: Every day | ORAL | 3 refills | Status: DC

## 2022-04-19 NOTE — Progress Notes (Signed)
? ?Subjective:  ? ? Patient ID: Candice Campbell, female    DOB: 1961/03/02, 61 y.o.   MRN: [id number] ? ?Donnella Morford is a 61 y.o. female presenting on 04/19/2022 for Weight Check ? ? ?HPI ? ?Morbid Obesity BMI >40 ?Pre-Diabetes ?Last A1c 6.0 (10/2020) ? ?Last visit 01/2022 she was started on Phentermine since insurance not approving GLP1 therapy ?- She has lost 16 lbs in past 3 months with this treatment ?- She tolerates it well overall has had appetite reduction, no major side effects except constipation. She drinks plenty of water and prune juice.  ?Weight started 243 lbs down to 227 lbs down by 16 lbs ?Exercise regimen 4 days a week and longer routine ?Reduced leg pain now feeling better. ?Diet: She has improved to healthy diet avoiding carbs, high protein, and drinks water ?   ?CHRONIC HTN: ?Very well controlled, feels better with weight loss ?Current Meds - valsartan 80mg , HCTZ 25mg  daily ?Reports good compliance, took meds today. Tolerating well, w/o complaints. ? ? ? ?  01/19/2022  ?  1:59 PM 10/20/2020  ? 10:28 AM 07/31/2019  ?  4:27 PM  ?Depression screen PHQ 2/9  ?Decreased Interest 1 1 2   ?Down, Depressed, Hopeless 0 0 1  ?PHQ - 2 Score 1 1 3   ?Altered sleeping 2 2 2   ?Tired, decreased energy 1 1 2   ?Change in appetite 1 1 1   ?Feeling bad or failure about yourself  1 0 1  ?Trouble concentrating 1 0 1  ?Moving slowly or fidgety/restless 0 0 1  ?Suicidal thoughts 0 0 0  ?PHQ-9 Score 7 5 11   ?Difficult doing work/chores Somewhat difficult Not difficult at all Somewhat difficult  ? ? ?Social History  ? ?Tobacco Use  ? Smoking status: Former  ?  Packs/day: 0.50  ?  Years: 10.00  ?  Pack years: 5.00  ?  Types: Cigarettes  ?  Quit date: 2016  ?  Years since quitting: 7.3  ? Smokeless tobacco: Never  ? Tobacco comments:  ?  pt quit smoking 10/2016  ?Substance Use Topics  ? Alcohol use: Not Currently  ?  Alcohol/week: 0.0 standard drinks  ?  Comment: occasional - 6x/yr  ? Drug use: No  ? ? ?Review of Systems ?Per HPI unless  specifically indicated above ? ?   ?Objective:  ?  ?BP 118/80   Pulse 67   Ht 5\' 3"  (1.6 m)   Wt 227 lb 9.6 oz (103.2 kg)   SpO2 99%   BMI 40.32 kg/m?   ?Wt Readings from Last 3 Encounters:  ?04/19/22 227 lb 9.6 oz (103.2 kg)  ?01/19/22 243 lb (110.2 kg)  ?12/23/20 223 lb (101.2 kg)  ?  ?Physical Exam ?Vitals and nursing note reviewed.  ?Constitutional:   ?   General: She is not in acute distress. ?   Appearance: Normal appearance. She is well-developed. She is not diaphoretic.  ?   Comments: Well-appearing, comfortable, cooperative  ?HENT:  ?   Head: Normocephalic and atraumatic.  ?Eyes:  ?   General:     ?   Right eye: No discharge.     ?   Left eye: No discharge.  ?   Conjunctiva/sclera: Conjunctivae normal.  ?Cardiovascular:  ?   Rate and Rhythm: Normal rate.  ?Pulmonary:  ?   Effort: Pulmonary effort is normal.  ?Skin: ?   General: Skin is warm and dry.  ?   Findings: No erythema or rash.  ?Neurological:  ?  Mental Status: She is alert and oriented to person, place, and time.  ?Psychiatric:     ?   Mood and Affect: Mood normal.     ?   Behavior: Behavior normal.     ?   Thought Content: Thought content normal.  ?   Comments: Well groomed, good eye contact, normal speech and thoughts  ? ? ? ?Results for orders placed or performed in visit on 10/20/20  ?Hemoglobin A1c  ?Result Value Ref Range  ? Hgb A1c MFr Bld 6.0 (H) <5.7 % of total Hgb  ? Mean Plasma Glucose 126 (calc)  ? eAG (mmol/L) 7.0 (calc)  ?CBC with Differential/Platelet  ?Result Value Ref Range  ? WBC 7.3 3.8 - 10.8 Thousand/uL  ? RBC 4.15 3.80 - 5.10 Million/uL  ? Hemoglobin 12.1 11.7 - 15.5 g/dL  ? HCT 37.0 35.0 - 45.0 %  ? MCV 89.2 80.0 - 100.0 fL  ? MCH 29.2 27.0 - 33.0 pg  ? MCHC 32.7 32.0 - 36.0 g/dL  ? RDW 13.5 11.0 - 15.0 %  ? Platelets 312 140 - 400 Thousand/uL  ? MPV 11.5 7.5 - 12.5 fL  ? Neutro Abs 4,125 1,500 - 7,800 cells/uL  ? Lymphs Abs 2,475 850 - 3,900 cells/uL  ? Absolute Monocytes 387 200 - 950 cells/uL  ? Eosinophils Absolute  241 15 - 500 cells/uL  ? Basophils Absolute 73 0 - 200 cells/uL  ? Neutrophils Relative % 56.5 %  ? Total Lymphocyte 33.9 %  ? Monocytes Relative 5.3 %  ? Eosinophils Relative 3.3 %  ? Basophils Relative 1.0 %  ?COMPLETE METABOLIC PANEL WITH GFR  ?Result Value Ref Range  ? Glucose, Bld 84 65 - 99 mg/dL  ? BUN 12 7 - 25 mg/dL  ? Creat 0.80 0.50 - 1.05 mg/dL  ? GFR, Est Non African American 81 > OR = 60 mL/min/1.30m2  ? GFR, Est African American 94 > OR = 60 mL/min/1.43m2  ? BUN/Creatinine Ratio NOT APPLICABLE 6 - 22 (calc)  ? Sodium 137 135 - 146 mmol/L  ? Potassium 4.1 3.5 - 5.3 mmol/L  ? Chloride 103 98 - 110 mmol/L  ? CO2 28 20 - 32 mmol/L  ? Calcium 9.4 8.6 - 10.4 mg/dL  ? Total Protein 6.7 6.1 - 8.1 g/dL  ? Albumin 3.7 3.6 - 5.1 g/dL  ? Globulin 3.0 1.9 - 3.7 g/dL (calc)  ? AG Ratio 1.2 1.0 - 2.5 (calc)  ? Total Bilirubin 0.3 0.2 - 1.2 mg/dL  ? Alkaline phosphatase (APISO) 47 37 - 153 U/L  ? AST 14 10 - 35 U/L  ? ALT 10 6 - 29 U/L  ?Lipid panel  ?Result Value Ref Range  ? Cholesterol 182 <200 mg/dL  ? HDL 52 > OR = 50 mg/dL  ? Triglycerides 98 <150 mg/dL  ? LDL Cholesterol (Calc) 110 (H) mg/dL (calc)  ? Total CHOL/HDL Ratio 3.5 <5.0 (calc)  ? Non-HDL Cholesterol (Calc) 130 (H) <130 mg/dL (calc)  ?TSH  ?Result Value Ref Range  ? TSH 0.97 0.40 - 4.50 mIU/L  ? ?   ?Assessment & Plan:  ? ?Problem List Items Addressed This Visit   ? ? Morbid obesity (HCC) - Primary  ? Relevant Medications  ? Phentermine-Topiramate (QSYMIA) 3.75-23 MG CP24  ? Essential hypertension  ? Relevant Medications  ? hydrochlorothiazide (HYDRODIURIL) 25 MG tablet  ? valsartan (DIOVAN) 80 MG tablet  ?  ?Morbid Obesity ?Improved wt loss on Phentermine 16 lbs down in  3 months ?Tolerating med well. BP remains controlled. ?Admits constipation side effect ?Refill Phentermine 37.5mg  daily (Note initially switched to Qysmia for improved results but cost is too high >$200 per month) DC that rx now and switch back to Phentermine. ?  ?HTN ?Stable,  controlled ?Continue meds - Valsartan 80 and HCTZ 25mg  daily ? ? ?Meds ordered this encounter  ?Medications  ? Phentermine-Topiramate (QSYMIA) 3.75-23 MG CP24  ?  Sig: Take 1 capsule by mouth daily. May increase dose after 4 weeks, please contact doctors office for new rx  ?  Dispense:  30 capsule  ?  Refill:  0  ? hydrochlorothiazide (HYDRODIURIL) 25 MG tablet  ?  Sig: Take 1 tablet (25 mg total) by mouth daily.  ?  Dispense:  90 tablet  ?  Refill:  3  ?  Add refills on file  ? valsartan (DIOVAN) 80 MG tablet  ?  Sig: Take 1 tablet (80 mg total) by mouth daily.  ?  Dispense:  90 tablet  ?  Refill:  3  ?  Add refills on file  ? ? ? ? ?Follow up plan: ?Return in about 5 months (around 09/19/2022) for 5-6 months follow-up Weight Management. ? ? ?Saralyn PilarAlexander Arlett Goold, DO ?[facility] ?[facility] ?04/19/2022, 8:43 AM ?

## 2022-04-19 NOTE — Telephone Encounter (Signed)
Pt.called said that phentermine Qsymia 3.75 was $215.00 a month  pt is requesting to be put back on the  other phentermine which is 17.00 a month

## 2022-04-19 NOTE — Patient Instructions (Addendum)
Thank you for coming to the office today. ? ?Keep up the great work overall. ? ?Stop Phentermine for now ?Switch to new version Qsymia - phentermine + Topiramate (appetite suppression) - one daily after 4 weeks notify us we can increase the dose. ? ?For Constipation (less frequent bowel movement that can be hard dry or involve straining). ? ?Recommend trying OTC Miralax 17g = 1 capful in large glass water once daily for now, try several days to see if working, goal is soft stool or BM 1-2 times daily, if too loose then reduce dose or try every other day. If not effective may need to increase it to 2 doses at once in AM or may do 1 in morning and 1 in afternoon/evening ? ?- This medicine is very safe and can be used often without any problem and will not make you dehydrated. It is good for use on AS NEEDED BASIS or even MAINTENANCE therapy for longer term for several days to weeks at a time to help regulate bowel movements ? ?Other more natural remedies or preventative treatment: ?- Increase hydration with water ?- Increase fiber in diet (high fiber foods = vegetables, leafy greens, oats/grains) ?- May take OTC Fiber supplement (metamucil powder or pill/gummy) ?- May try OTC Probiotic ? ?----------------- ? ?Please schedule a Follow-up Appointment to: Return in about 5 months (around 09/19/2022) for 5-6 months follow-up Weight Management. ? ?If you have any other questions or concerns, please feel free to call the office or send a message through [place]. You may also schedule an earlier appointment if necessary. ? ?Additionally, you may be receiving a survey about your experience at our office within a few days to 1 week by e-mail or mail. We value your feedback. ? ?Nobie Putnam, DO ?[place] ?

## 2022-04-19 NOTE — Addendum Note (Signed)
Addended by: Smitty Cords on: 04/19/2022 10:43 AM ? ? Modules accepted: Orders ? ?

## 2022-04-19 NOTE — Telephone Encounter (Signed)
FYI if she calls back - I will discontinue new med and send the previous medication to her pharmacy ? ?Will send Phentermine again. ? ?Saralyn [name], DO ?[facility] ?[facility] ?04/19/2022, 10:43 AM ? ?

## 2022-06-08 ENCOUNTER — Other Ambulatory Visit: Payer: Self-pay | Admitting: Family Medicine

## 2022-06-08 MED ORDER — PHENTERMINE HCL 37.5 MG PO CAPS: 37.5000 mg | ORAL_CAPSULE | ORAL | 5 refills | Status: DC

## 2022-06-08 NOTE — Telephone Encounter (Signed)
Medication Refill - Medication: phentermine 37.5 MG capsule   Pt is completely out   Has the patient contacted their pharmacy? no (Agent: If no, request that the patient contact the pharmacy for the refill. If patient does not wish to contact the pharmacy document the reason why and proceed with request.) (Agent: If yes, when and what did the pharmacy advise?)  Preferred Pharmacy (with phone number or street name):  [facility] [address]), [place] - [address]  [address]) [place] [address]  Phone: [phone] Fax: [phone]   Has the patient been seen for an appointment in the last year OR does the patient have an upcoming appointment? Yes.    Agent: Please be advised that RX refills may take up to 3 business days. We ask that you follow-up with your pharmacy.

## 2022-06-08 NOTE — Telephone Encounter (Signed)
Requested medication (s) are due for refill today: no  Requested medication (s) are on the active medication list: yes  Last refill:  04/19/22 #30  5 RF  Future visit scheduled: no  Notes to clinic:  Called pharmacy and pt has refills. NT not delegated to refuse this medication   Requested Prescriptions  Pending Prescriptions Disp Refills   phentermine 37.5 MG capsule 30 capsule 5    Sig: Take 1 capsule (37.5 mg total) by mouth every morning.     Not Delegated - Neurology: Anticonvulsants - Controlled - phentermine hydrochloride Failed - 06/08/2022 11:52 AM      Failed - This refill cannot be delegated      Failed - eGFR in normal range and within 360 days    GFR, Est African American  Date Value Ref Range Status  10/20/2020 94 > OR = 60 mL/min/1.73m2 Final   GFR, Est Non African American  Date Value Ref Range Status  10/20/2020 81 > OR = 60 mL/min/1.73m2 Final         Failed - Cr in normal range and within 360 days    Creat  Date Value Ref Range Status  10/20/2020 0.80 0.50 - 1.05 mg/dL Final    Comment:    For patients >49 years of age, the reference limit for Creatinine is approximately 13% higher for people identified as African-American. .          Passed - Last BP in normal range    BP Readings from Last 1 Encounters:  04/19/22 118/80         Passed - Valid encounter within last 6 months    Recent Outpatient Visits           1 month ago Morbid obesity (HCC)   [facility] Karamalegos, Alexander J, DO   4 months ago Essential hypertension   [facility] Karamalegos, Alexander J, DO   1 year ago Morbid obesity (HCC)   [facility] Karamalegos, Alexander J, DO   1 year ago Annual physical exam   [facility] Karamalegos, Alexander J, DO   1 year ago Abnormal urinalysis   [facility] Malfi, Nicole M, FNP              Passed - Weight completed in the last 3 months    Wt  Readings from Last 1 Encounters:  04/19/22 227 lb 9.6 oz (103.2 kg)             

## 2022-08-12 ENCOUNTER — Encounter: Payer: Self-pay | Admitting: Internal Medicine

## 2022-08-12 NOTE — Progress Notes (Deleted)
Commercial Driver Medical Examination   Candice Campbell is a 61 y.o. female who presents today for a commercial driver fitness determination physical exam. The patient reports {problems:16946::"no problems"}. She has a history of HTN or prediabetes. {Common ambulatory SmartLinks:19316} Review of Systems {ros - complete:30496}   Objective:    Vision:  Uncorrected Corrected Horizontal Field of Vision  Right Eye {result:19455::"20/20"} {result:19455::"20/20"} *** degrees  Left Eye  {result:19455::"20/20"} {result:19455::"20/20"} *** degrees  Both Eyes  {result:19455::"20/20"} {result:19455::"20/20"}    Applicant {can/cannot:17900::"can"} recognize and distinguish among traffic control signals and devices showing standard red, green, and amber colors.  {Corrective lens required?:19458::" "}  Monocular Vision?: {Yes/No:18319::"No"}   Hearing:   500 Hz 1000 Hz 2000 Hz 4000 Hz  Right Ear  {result:19456} {result:19456} {result:19456} {result:19456}  Left Ear  {result:19456} {result:19456} {result:19456} {result:19456}   {Hearing aid requirement:19457::" "}  {Exam, Complete:17964}  Labs: Lab Results  Component Value Date   SPECGRAV 1.010 07/08/2020   PROTEINUR Negative 07/08/2020   BILIRUBINUR negative 07/08/2020      Assessment:    Healthy female exam.  {Determination:19453}    Plan:    {Plan:19460::"Medical examiners certificate completed and printed.","Return as needed."}     Nicki Reaper, NP

## 2022-10-17 ENCOUNTER — Ambulatory Visit (INDEPENDENT_AMBULATORY_CARE_PROVIDER_SITE_OTHER): Payer: Self-pay | Admitting: Family Medicine

## 2022-10-17 ENCOUNTER — Encounter: Payer: Self-pay | Admitting: Family Medicine

## 2022-10-17 VITALS — BP 122/69 | HR 76 | Ht 63.0 in | Wt 220.0 lb

## 2022-10-17 DIAGNOSIS — R7303 Prediabetes: Secondary | ICD-10-CM

## 2022-10-17 DIAGNOSIS — E7800 Pure hypercholesterolemia, unspecified: Secondary | ICD-10-CM

## 2022-10-17 DIAGNOSIS — N951 Menopausal and female climacteric states: Secondary | ICD-10-CM

## 2022-10-17 DIAGNOSIS — I10 Essential (primary) hypertension: Secondary | ICD-10-CM

## 2022-10-17 MED ORDER — PHENTERMINE HCL 37.5 MG PO CAPS: 37.5000 mg | ORAL_CAPSULE | ORAL | 5 refills | Status: DC

## 2022-10-17 MED ORDER — VALSARTAN 80 MG PO TABS: 80.0000 mg | ORAL_TABLET | Freq: Every day | ORAL | 11 refills | Status: DC

## 2022-10-17 MED ORDER — GABAPENTIN 100 MG PO CAPS: 300.0000 mg | ORAL_CAPSULE | Freq: Every day | ORAL | 5 refills | Status: DC

## 2022-10-17 MED ORDER — HYDROCHLOROTHIAZIDE 25 MG PO TABS: 25.0000 mg | ORAL_TABLET | Freq: Every day | ORAL | 11 refills | Status: DC

## 2022-10-17 NOTE — Progress Notes (Unsigned)
Subjective:    Patient ID: Candice Campbell, female    DOB: 13-Nov-1961, 61 y.o.   MRN: [id number]  Candice Campbell is a 61 y.o. female presenting on 10/17/2022 for No chief complaint on file.   HPI  Morbid Obesity BMI >40 Pre-Diabetes Last A1c 6.0 (10/2020)   Last visit 01/2022 she was started on Phentermine since insurance not approving GLP1 therapy - She has lost 16 lbs in past 3 months with this treatment - She tolerates it well overall has had appetite reduction, no major side effects except constipation. She drinks plenty of water and prune juice.  Weight started 243 lbs down to 227 lbs down by 16 lbs Exercise regimen 4 days a week and longer routine Reduced leg pain now feeling better. Diet: She has improved to healthy diet avoiding carbs, high protein, and drinks water  ***     CHRONIC HTN: Very well controlled, feels better with weight loss Current Meds - valsartan 80mg , HCTZ 25mg  daily Reports good compliance, took meds today. Tolerating well, w/o complaints.  Hot Flashes Waking up at night ***    Health Maintenance: ***     01/19/2022    1:59 PM 10/20/2020   10:28 AM 07/31/2019    4:27 PM  Depression screen PHQ 2/9  Decreased Interest 1 1 2   Down, Depressed, Hopeless 0 0 1  PHQ - 2 Score 1 1 3   Altered sleeping 2 2 2   Tired, decreased energy 1 1 2   Change in appetite 1 1 1   Feeling bad or failure about yourself  1 0 1  Trouble concentrating 1 0 1  Moving slowly or fidgety/restless 0 0 1  Suicidal thoughts 0 0 0  PHQ-9 Score 7 5 11   Difficult doing work/chores Somewhat difficult Not difficult at all Somewhat difficult    Social History   Tobacco Use   Smoking status: Former    Packs/day: 0.50    Years: 10.00    Total pack years: 5.00    Types: Cigarettes    Quit date: 2016    Years since quitting: 7.8   Smokeless tobacco: Never   Tobacco comments:    pt quit smoking 10/2016  Substance Use Topics   Alcohol use: Not Currently    Alcohol/week: 0.0  standard drinks of alcohol    Comment: occasional - 6x/yr   Drug use: No    Review of Systems Per HPI unless specifically indicated above     Objective:    BP 122/69   Pulse 76   Ht 5\' 3"  (1.6 m)   Wt 220 lb (99.8 kg)   SpO2 100%   BMI 38.97 kg/m   Wt Readings from Last 3 Encounters:  10/17/22 220 lb (99.8 kg)  04/19/22 227 lb 9.6 oz (103.2 kg)  01/19/22 243 lb (110.2 kg)    Physical Exam   Results for orders placed or performed in visit on 10/20/20  Hemoglobin A1c  Result Value Ref Range   Hgb A1c MFr Bld 6.0 (H) <5.7 % of total Hgb   Mean Plasma Glucose 126 (calc)   eAG (mmol/L) 7.0 (calc)  CBC with Differential/Platelet  Result Value Ref Range   WBC 7.3 3.8 - 10.8 Thousand/uL   RBC 4.15 3.80 - 5.10 Million/uL   Hemoglobin 12.1 11.7 - 15.5 g/dL   HCT  - 2017 %   MCV 89.2 80.0 - 100.0 fL   MCH 29.2 27.0 - 33.0 pg   MCHC 32.7 32.0 - 36.0 g/dL  RDW 13.5 11.0 - 15.0 %   Platelets 312 140 - 400 Thousand/uL   MPV 11.5 7.5 - 12.5 fL   Neutro Abs 4,125 1,500 - 7,800 cells/uL   Lymphs Abs 2,475 850 - 3,900 cells/uL   Absolute Monocytes 387 200 - 950 cells/uL   Eosinophils Absolute 241 15 - 500 cells/uL   Basophils Absolute 73 0 - 200 cells/uL   Neutrophils Relative % 56.5 %   Total Lymphocyte 33.9 %   Monocytes Relative 5.3 %   Eosinophils Relative 3.3 %   Basophils Relative 1.0 %  COMPLETE METABOLIC PANEL WITH GFR  Result Value Ref Range   Glucose, Bld 84 65 - 99 mg/dL   BUN 12 7 - 25 mg/dL   Creat 0.80 0.50 - 1.05 mg/dL   GFR, Est Non African American 81 > OR = 60 mL/min/1.66m2   GFR, Est African American 94 > OR = 60 mL/min/1.69m2   BUN/Creatinine Ratio NOT APPLICABLE 6 - 22 (calc)   Sodium 137 135 - 146 mmol/L   Potassium 4.1 3.5 - 5.3 mmol/L   Chloride 103 98 - 110 mmol/L   CO2 28 20 - 32 mmol/L   Calcium 9.4 8.6 - 10.4 mg/dL   Total Protein 6.7 6.1 - 8.1 g/dL   Albumin 3.7 3.6 - 5.1 g/dL   Globulin 3.0 1.9 - 3.7 g/dL (calc)   AG Ratio 1.2  1.0 - 2.5 (calc)   Total Bilirubin 0.3 0.2 - 1.2 mg/dL   Alkaline phosphatase (APISO) 47 37 - 153 U/L   AST 14 10 - 35 U/L   ALT 10 6 - 29 U/L  Lipid panel  Result Value Ref Range   Cholesterol 182 <200 mg/dL   HDL 52 > OR = 50 mg/dL   Triglycerides 98 <150 mg/dL   LDL Cholesterol (Calc) 110 (H) mg/dL (calc)   Total CHOL/HDL Ratio 3.5 <5.0 (calc)   Non-HDL Cholesterol (Calc) 130 (H) <130 mg/dL (calc)  TSH  Result Value Ref Range   TSH 0.97 0.40 - 4.50 mIU/L      Assessment & Plan:   Problem List Items Addressed This Visit   None   No orders of the defined types were placed in this encounter.     Follow up plan: No follow-ups on file.  Future labs ordered for ***  Future labs CMET CBC Lipid A1c TSH   Nobie Putnam, DO [facility] 10/17/2022, 2:41 PM

## 2022-10-17 NOTE — Patient Instructions (Addendum)
Thank you for coming to the office today.  Start Gabapentin 100mg  at bedtime for hot flash, then gradually increase as tolerated. Max is 300mg  at night  30 day orders to [place] BP pills for 30 days + 11 refills Phentermine 30 days + 5 refills  If you want to do 90 days - for the BP meds. We could order through "[employer]" company through [place] for mail order.  Black Cohosh herbal supplement for hot flashes.  For Constipation (less frequent bowel movement that can be hard dry or involve straining).  Recommend trying OTC Miralax 17g = 1 capful in large glass water once daily for now, try several days to see if working, goal is soft stool or BM 1-2 times daily, if too loose then reduce dose or try every other day. If not effective may need to increase it to 2 doses at once in AM or may do 1 in morning and 1 in afternoon/evening  - This medicine is very safe and can be used often without any problem and will not make you dehydrated. It is good for use on AS NEEDED BASIS or even MAINTENANCE therapy for longer term for several days to weeks at a time to help regulate bowel movements  Other more natural remedies or preventative treatment: - Increase hydration with water - Increase fiber in diet (high fiber foods = vegetables, leafy greens, oats/grains) - May take OTC Fiber supplement (metamucil powder or pill/gummy) - May try OTC Probiotic  DUE for FASTING BLOOD WORK (no food or drink after midnight before the lab appointment, only water or coffee without cream/sugar on the morning of)  SCHEDULE "Lab Only" visit in the morning at the clinic for lab draw in 2 weeks  - Make sure Lab Only appointment is at about 1 week before your next appointment, so that results will be available  For Lab Results, once available within 2-3 days of blood draw, you can can log in to MyChart online to view your results and a brief explanation. Also, we can discuss results at next follow-up  visit.    Please schedule a Follow-up Appointment to: Return in about 11 days (around 10/28/2022) for Fasting lab only on Fri 11/17 815am.  If you have any other questions or concerns, please feel free to call the office or send a message through [place]. You may also schedule an earlier appointment if necessary.  Additionally, you may be receiving a survey about your experience at our office within a few days to 1 week by e-mail or mail. We value your feedback.  Nobie Putnam, DO [place]

## 2022-10-18 DIAGNOSIS — E7800 Pure hypercholesterolemia, unspecified: Secondary | ICD-10-CM | POA: Insufficient documentation

## 2022-10-27 ENCOUNTER — Other Ambulatory Visit: Payer: Self-pay

## 2022-10-27 DIAGNOSIS — R7303 Prediabetes: Secondary | ICD-10-CM

## 2022-10-27 DIAGNOSIS — I10 Essential (primary) hypertension: Secondary | ICD-10-CM

## 2022-10-27 DIAGNOSIS — E7800 Pure hypercholesterolemia, unspecified: Secondary | ICD-10-CM

## 2022-10-28 ENCOUNTER — Other Ambulatory Visit: Payer: 59

## 2022-10-28 DIAGNOSIS — I10 Essential (primary) hypertension: Secondary | ICD-10-CM | POA: Diagnosis not present

## 2022-10-28 DIAGNOSIS — R7303 Prediabetes: Secondary | ICD-10-CM | POA: Diagnosis not present

## 2022-10-28 DIAGNOSIS — E7800 Pure hypercholesterolemia, unspecified: Secondary | ICD-10-CM | POA: Diagnosis not present

## 2022-10-29 LAB — CBC WITH DIFFERENTIAL/PLATELET
Absolute Monocytes: 428 cells/uL (ref 200–950)
Basophils Absolute: 68 cells/uL (ref 0–200)
Basophils Relative: 0.9 %
Eosinophils Absolute: 188 cells/uL (ref 15–500)
Eosinophils Relative: 2.5 %
HCT: 37.9 % (ref 35.0–45.0)
Hemoglobin: 12.7 g/dL (ref 11.7–15.5)
Lymphs Abs: 2483 cells/uL (ref 850–3900)
MCH: 29.1 pg (ref 27.0–33.0)
MCHC: 33.5 g/dL (ref 32.0–36.0)
MCV: 86.9 fL (ref 80.0–100.0)
MPV: 11.1 fL (ref 7.5–12.5)
Monocytes Relative: 5.7 %
Neutro Abs: 4335 cells/uL (ref 1500–7800)
Neutrophils Relative %: 57.8 %
Platelets: 319 10*3/uL (ref 140–400)
RBC: 4.36 10*6/uL (ref 3.80–5.10)
RDW: 13.7 % (ref 11.0–15.0)
Total Lymphocyte: 33.1 %
WBC: 7.5 10*3/uL (ref 3.8–10.8)

## 2022-10-29 LAB — COMPLETE METABOLIC PANEL WITH GFR
AG Ratio: 1.1 (calc) (ref 1.0–2.5)
ALT: 9 U/L (ref 6–29)
AST: 14 U/L (ref 10–35)
Albumin: 3.6 g/dL (ref 3.6–5.1)
Alkaline phosphatase (APISO): 50 U/L (ref 37–153)
BUN: 17 mg/dL (ref 7–25)
CO2: 31 mmol/L (ref 20–32)
Calcium: 9.4 mg/dL (ref 8.6–10.4)
Chloride: 101 mmol/L (ref 98–110)
Creat: 0.88 mg/dL (ref 0.50–1.05)
Globulin: 3.4 g/dL (calc) (ref 1.9–3.7)
Glucose, Bld: 85 mg/dL (ref 65–99)
Potassium: 4.2 mmol/L (ref 3.5–5.3)
Sodium: 138 mmol/L (ref 135–146)
Total Bilirubin: 0.3 mg/dL (ref 0.2–1.2)
Total Protein: 7 g/dL (ref 6.1–8.1)
eGFR: 75 mL/min/{1.73_m2} (ref >=60)

## 2022-10-29 LAB — LIPID PANEL
Cholesterol: 185 mg/dL (ref <200)
HDL: 57 mg/dL (ref >=50)
LDL Cholesterol (Calc): 113 mg/dL (calc) — ABNORMAL HIGH
Non-HDL Cholesterol (Calc): 128 mg/dL (calc) (ref <130)
Total CHOL/HDL Ratio: 3.2 (calc) (ref <5.0)
Triglycerides: 67 mg/dL (ref <150)

## 2022-10-29 LAB — TSH: TSH: 1.49 mIU/L (ref 0.40–4.50)

## 2022-10-29 LAB — HEMOGLOBIN A1C
Hgb A1c MFr Bld: 6.1 % of total Hgb — ABNORMAL HIGH (ref <5.7)
Mean Plasma Glucose: 128 mg/dL
eAG (mmol/L): 7.1 mmol/L

## 2023-02-02 ENCOUNTER — Ambulatory Visit: Payer: 59 | Admitting: Family Medicine

## 2023-02-03 ENCOUNTER — Ambulatory Visit (INDEPENDENT_AMBULATORY_CARE_PROVIDER_SITE_OTHER): Payer: 59 | Admitting: Family Medicine

## 2023-02-03 ENCOUNTER — Encounter: Payer: Self-pay | Admitting: Family Medicine

## 2023-02-03 VITALS — BP 132/78 | HR 65 | Ht 63.0 in | Wt 229.0 lb

## 2023-02-03 DIAGNOSIS — M7551 Bursitis of right shoulder: Secondary | ICD-10-CM | POA: Diagnosis not present

## 2023-02-03 DIAGNOSIS — G8929 Other chronic pain: Secondary | ICD-10-CM

## 2023-02-03 DIAGNOSIS — M25511 Pain in right shoulder: Secondary | ICD-10-CM | POA: Diagnosis not present

## 2023-02-03 MED ORDER — METHYLPREDNISOLONE ACETATE 40 MG/ML IJ SUSP
40.0000 mg | Freq: Once | INTRAMUSCULAR | Status: AC
  Administered 2023-02-03: 40 mg via INTRA_ARTICULAR

## 2023-02-03 MED ORDER — LIDOCAINE HCL (PF) 1 % IJ SOLN
4.0000 mL | Freq: Once | INTRAMUSCULAR | Status: AC
  Administered 2023-02-03: 4 mL

## 2023-02-03 NOTE — Patient Instructions (Addendum)
Thank you for coming to the office today.  You received a Right Shoulder Joint steroid injection today. - Lidocaine numbing medicine may ease the pain initially for a few hours until it wears off - As discussed, you may experience a "steroid flare" this evening or within 24-48 hours, anytime medicine is injected into an inflamed joint it can cause the pain to get worse temporarily - Everyone responds differently to these injections, it depends on the patient and the severity of the joint problem, it may provide anywhere from days to weeks, to months of relief. Ideal response is >6 months relief - Try to take it easy for next 1-2 days, avoid over activity and strain on joint (limit lifting for shoulder) - Recommend the following:   - For swelling - rest, compression sleeve / ACE wrap, elevation, and ice packs as needed for first few days   - For pain in future may use heating pad or moist heat as needed  If not improving as expected over next several weeks, please follow-up sooner for re-evaluation.  Can repeat shot in 3 months if need or refer to Ortho or Sports Med for Physical therapy rehab.   Please schedule a Follow-up Appointment to: Return in about 3 months (around 05/04/2023) for 3 month Right Shoulder follow-up bursitis.  If you have any other questions or concerns, please feel free to call the office or send a message through [place]. You may also schedule an earlier appointment if necessary.  Additionally, you may be receiving a survey about your experience at our office within a few days to 1 week by e-mail or mail. We value your feedback.  Nobie Putnam, DO [facility], [facility]  Range of Motion Shoulder Exercises  [place] with your good arm against a counter or table for support [facility] forward with a wide stance (make sure your body is comfortable) - Your painful shoulder should hang down and feel "heavy" - Gently move your painful arm in  small circles "clockwise" for several turns - Switch to "counterclockwise" for several turns - Early on keep circles narrow and move slowly - Later in rehab, move in larger circles and faster movement   Wall Crawl - Stand close (about 1-2 ft away) to a wall, facing it directly - Reach out with your arm of painful shoulder and place fingers (not palm) on wall - You should make contact with wall at your waist level - Slowly walk your fingers up the wall. Stay in contact with wall entire time, do not remove fingers - Keep walking fingers up wall until you reach shoulder level - You may feel tightening or mild discomfort, once you reach a height that causes pain or if you are already above your shoulder height then stop. Repeat from starting position. - Early on stand closer to wall, move fingers slowly, and stay at or below shoulder level - Later in rehab, stand farther away from wall (fingertips), move fingers quicker, go above shoulder level
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
## 2023-02-04 ENCOUNTER — Encounter: Payer: Self-pay | Admitting: Family Medicine

## 2023-04-18 ENCOUNTER — Other Ambulatory Visit: Payer: Self-pay | Admitting: Family Medicine

## 2023-04-18 NOTE — Telephone Encounter (Signed)
Requested medication (s) are due for refill today: yes  Requested medication (s) are on the active medication list: yes  Last refill:  10/17/22  Future visit scheduled: yes  Notes to clinic:  Unable to refill per protocol, cannot delegate.      Requested Prescriptions  Pending Prescriptions Disp Refills   phentermine 37.5 MG capsule [Pharmacy Med Name: Phentermine HCl 37.5 MG Oral Capsule] 30 capsule 0    Sig: Take 1 capsule by mouth in the morning     Not Delegated - Neurology: Anticonvulsants - Controlled - phentermine hydrochloride Failed - 04/18/2023  9:53 AM      Failed - This refill cannot be delegated      Passed - eGFR in normal range and within 360 days    GFR, Est African American  Date Value Ref Range Status  10/20/2020 94 > OR = 60 mL/min/1.58m2 Final   GFR, Est Non African American  Date Value Ref Range Status  10/20/2020 81 > OR = 60 mL/min/1.29m2 Final   eGFR  Date Value Ref Range Status  10/28/2022 75 > OR = 60 mL/min/1.32m2 Final         Passed - Cr in normal range and within 360 days    Creat  Date Value Ref Range Status  10/28/2022 0.88 0.50 - 1.05 mg/dL Final         Passed - Last BP in normal range    BP Readings from Last 1 Encounters:  02/03/23 132/78         Passed - Valid encounter within last 6 months    Recent Outpatient Visits           2 months ago Chronic bursitis of right shoulder   [place] [facility] Smitty Cords, DO   6 months ago Essential hypertension   [place] [facility] Smitty Cords, DO   12 months ago Morbid obesity [facility])   [place] [facility] Smitty Cords, DO   1 year ago Essential hypertension   [place] [facility] Smitty Cords, DO   2 years ago Morbid obesity [facility])    [facility] Alder, Netta Neat, DO              Passed - Weight completed in the last  3 months    Wt Readings from Last 1 Encounters:  02/03/23 229 lb (103.9 kg)

## 2023-08-18 ENCOUNTER — Ambulatory Visit (INDEPENDENT_AMBULATORY_CARE_PROVIDER_SITE_OTHER): Payer: 59 | Admitting: Family Medicine

## 2023-08-18 ENCOUNTER — Encounter: Payer: Self-pay | Admitting: Family Medicine

## 2023-08-18 VITALS — BP 130/84 | HR 65 | Ht 63.0 in | Wt 238.0 lb

## 2023-08-18 DIAGNOSIS — Z23 Encounter for immunization: Secondary | ICD-10-CM

## 2023-08-18 DIAGNOSIS — R7303 Prediabetes: Secondary | ICD-10-CM

## 2023-08-18 DIAGNOSIS — I10 Essential (primary) hypertension: Secondary | ICD-10-CM | POA: Diagnosis not present

## 2023-08-18 LAB — POCT GLYCOSYLATED HEMOGLOBIN (HGB A1C): Hemoglobin A1C: 5.7 % — AB (ref 4.0–5.6)

## 2023-08-18 NOTE — Progress Notes (Signed)
Subjective:    Patient ID: Candice Campbell, female    DOB: 1961-10-18, 62 y.o.   MRN: [id number]  Kitra Arzt is a 62 y.o. female presenting on 08/18/2023 for Hypertension   HPI  Morbid Obesity BMI >40 Pre-Diabetes  Last result A1c 6.1 down to 5.7 today.  Previously on Phentermine and she was able to lose weight. Down to 220 lbs, now wt back up to 238 lbs, has stopped Phentermine as it was not as effective. - Continues to exercise 3 days a week - Issue with diet and appetite, still eating larger portions - Goal to do more meal prep   Last visit 01/2022 she was started on Phentermine since insurance not approving GLP1 therapy  Diet: She has improved to healthy diet avoiding carbs, high protein, and drinks water   CHRONIC HTN: Very well controlled, feels better with weight loss Current Meds - valsartan 80mg , HCTZ 25mg  daily Reports good compliance, took meds today. Tolerating well, w/o complaints.       01/19/2022    1:59 PM 10/20/2020   10:28 AM 07/31/2019    4:27 PM  Depression screen PHQ 2/9  Decreased Interest 1 1 2   Down, Depressed, Hopeless 0 0 1  PHQ - 2 Score 1 1 3   Altered sleeping 2 2 2   Tired, decreased energy 1 1 2   Change in appetite 1 1 1   Feeling bad or failure about yourself  1 0 1  Trouble concentrating 1 0 1  Moving slowly or fidgety/restless 0 0 1  Suicidal thoughts 0 0 0  PHQ-9 Score 7 5 11   Difficult doing work/chores Somewhat difficult Not difficult at all Somewhat difficult    Social History   Tobacco Use   Smoking status: Former    Current packs/day: 0.00    Average packs/day: 0.5 packs/day for 10.0 years (5.0 ttl pk-yrs)    Types: Cigarettes    Start date: 2006    Quit date: 2016    Years since quitting: 8.6   Smokeless tobacco: Never   Tobacco comments:    pt quit smoking 10/2016  Substance Use Topics   Alcohol use: Not Currently    Alcohol/week: 0.0 standard drinks of alcohol    Comment: occasional - 6x/yr   Drug use: No     Review of Systems Per HPI unless specifically indicated above     Objective:    BP 130/84   Pulse 65   Ht 5\' 3"  (1.6 m)   Wt 238 lb (108 kg)   SpO2 97%   BMI 42.16 kg/m   Wt Readings from Last 3 Encounters:  08/18/23 238 lb (108 kg)  02/03/23 229 lb (103.9 kg)  10/17/22 220 lb (99.8 kg)    Physical Exam Vitals and nursing note reviewed.  Constitutional:      General: She is not in acute distress.    Appearance: She is well-developed. She is obese. She is not diaphoretic.     Comments: Well-appearing, comfortable, cooperative  HENT:     Head: Normocephalic and atraumatic.  Eyes:     General:        Right eye: No discharge.        Left eye: No discharge.     Conjunctiva/sclera: Conjunctivae normal.  Neck:     Thyroid: No thyromegaly.  Cardiovascular:     Rate and Rhythm: Normal rate and regular rhythm.     Heart sounds: Normal heart sounds. No murmur heard. Pulmonary:     Effort:  Pulmonary effort is normal. No respiratory distress.     Breath sounds: Normal breath sounds. No wheezing or rales.  Musculoskeletal:        General: Normal range of motion.     Cervical back: Normal range of motion and neck supple.  Lymphadenopathy:     Cervical: No cervical adenopathy.  Skin:    General: Skin is warm and dry.     Findings: No erythema or rash.  Neurological:     Mental Status: She is alert and oriented to person, place, and time.  Psychiatric:        Behavior: Behavior normal.     Comments: Well groomed, good eye contact, normal speech and thoughts      Results for orders placed or performed in visit on 08/18/23  POCT glycosylated hemoglobin (Hb A1C)  Result Value Ref Range   Hemoglobin A1C 5.7 (A) 4.0 - 5.6 %   HbA1c POC (<> result, manual entry)     HbA1c, POC (prediabetic range)     HbA1c, POC (controlled diabetic range)        Assessment & Plan:   Problem List Items Addressed This Visit     Essential hypertension   Morbid obesity (HCC)    Pre-diabetes - Primary   Relevant Orders   POCT glycosylated hemoglobin (Hb A1C) (Completed)   Other Visit Diagnoses     Need for influenza vaccination       Relevant Orders   Flu vaccine trivalent PF, 6mos and older(Flulaval,Afluria,Fluarix,Fluzone) (Completed)       Improved A1c 6.1 to 5.7 Weight remains abnormal, morbid obesity Unsuccessful with prior wt loss measures, failed phentermine and lifestyle intervention  Sample [facility] today 4 pen box, 0.25mg  dosing x 4 weeks  Info on compounding pharmacy locally [facility], can proceed w/ 0.5mg  weekly Semaglutide dosing if interested at their price.  Also contact info on [employer] coverage check. We discussed that previously issue with coverage, concern if Wegovy vs Zepbound are covered. But if they are we can pursue actual rx.   Orders Placed This Encounter  Procedures   Flu vaccine trivalent PF, 6mos and older(Flulaval,Afluria,Fluarix,Fluzone)   POCT glycosylated hemoglobin (Hb A1C)     No orders of the defined types were placed in this encounter.     Follow up plan: Return in about 3 months (around 11/17/2023) for 3 month follow-up weight.   Saralyn [name], DO [facility] 08/18/2023, 8:41 AM

## 2023-08-18 NOTE — Patient Instructions (Addendum)
Thank you for coming to the office today.  Check with insurance on the following:  [employer] Zepbound  Also from the [employer] They offer the top of the line - Zepbound, actual medication in a vial for $375 for lowest dose and higher dose 5mg  is $500 approximately.   For Weight Loss / Obesity only  Contrave - oral medication, appetite suppression has wellbutrin/bupropion and naltrexone in it and it can also help with appetite, it is ordered through a speciality pharmacy. - $99 per month  Free sample 7 day, 1 pill per day for 1 week  Alternative options  Semaglutide injection (mixed Ozempic) from [facility] 0.25mg  weekly for 4 weeks then increase to 0.5mg  weekly It comes in a vial and a needle syringe, you need to draw up the shot and self admin it weekly Cost is about $200 per month Call them to check pricing and availability  [facility] Address: [address], [place], [place] [address] Phone: [phone]  --------------------------------  Doctors office in [place], does not accept insurance. Call to schedule / Walk in to schedule They can do the weekly weight loss injections (same as Ozempic) Pay per visit and per shot. It may be approximately $60-75+ per visit/shot, approximately $200-300 range per month depending  [facility] Address: [address] [place], [place] [address] Phone: [phone]    Please schedule a Follow-up Appointment to: Return in about 3 months (around 11/17/2023) for 3 month follow-up weight.  If you have any other questions or concerns, please feel free to call the office or send a message through MyChart. You may also schedule an earlier appointment if necessary.  Additionally, you may be receiving a survey about your experience at our office within a few days to 1 week by e-mail or mail. We value your feedback.  Saralyn [name], DO [facility], [place]

## 2023-10-27 ENCOUNTER — Telehealth: Payer: Self-pay | Admitting: Family Medicine

## 2023-10-27 ENCOUNTER — Other Ambulatory Visit: Payer: Self-pay | Admitting: Family Medicine

## 2023-10-27 DIAGNOSIS — I10 Essential (primary) hypertension: Secondary | ICD-10-CM

## 2023-10-27 MED ORDER — VALSARTAN 80 MG PO TABS
80.0000 mg | ORAL_TABLET | Freq: Every day | ORAL | 0 refills | Status: DC
Start: 2023-10-27 — End: 2023-11-20

## 2023-10-27 NOTE — Telephone Encounter (Signed)
Medication Refill -  Most Recent Primary Care Visit:  Provider: Smitty Cords  Department: [facility]  Visit Type: OFFICE VISIT  Date: 08/18/2023  Medication: valsartan (DIOVAN) 80 MG tablet and hydrochlorothiazide (HYDRODIURIL) 25 MG tablet   Has the patient contacted their pharmacy? Yes  Is this the correct pharmacy for this prescription? Yes  This is the patient's preferred pharmacy: [facility] [address]), [place] - [address] [place]) [place] [address] Phone: [phone] Fax: [phone]  Has the prescription been filled recently? Yes  Is the patient out of the medication? Yes  Has the patient been seen for an appointment in the last year OR does the patient have an upcoming appointment? Yes  Can we respond through MyChart? Yes  Agent: Please be advised that Rx refills may take up to 3 business days. We ask that you follow-up with your pharmacy.  Patient took her last pill on last night

## 2023-10-27 NOTE — Telephone Encounter (Signed)
Diovan sent

## 2023-10-31 ENCOUNTER — Other Ambulatory Visit: Payer: Self-pay | Admitting: Family Medicine

## 2023-10-31 DIAGNOSIS — I10 Essential (primary) hypertension: Secondary | ICD-10-CM

## 2023-11-20 ENCOUNTER — Other Ambulatory Visit: Payer: Self-pay | Admitting: Family Medicine

## 2023-11-20 ENCOUNTER — Ambulatory Visit (INDEPENDENT_AMBULATORY_CARE_PROVIDER_SITE_OTHER): Payer: 59 | Admitting: Family Medicine

## 2023-11-20 ENCOUNTER — Encounter: Payer: Self-pay | Admitting: Family Medicine

## 2023-11-20 DIAGNOSIS — R7303 Prediabetes: Secondary | ICD-10-CM | POA: Diagnosis not present

## 2023-11-20 DIAGNOSIS — Z1231 Encounter for screening mammogram for malignant neoplasm of breast: Secondary | ICD-10-CM

## 2023-11-20 DIAGNOSIS — I10 Essential (primary) hypertension: Secondary | ICD-10-CM

## 2023-11-20 DIAGNOSIS — E7800 Pure hypercholesterolemia, unspecified: Secondary | ICD-10-CM

## 2023-11-20 DIAGNOSIS — Z0000 Encounter for general adult medical examination without abnormal findings: Secondary | ICD-10-CM

## 2023-11-20 MED ORDER — HYDROCHLOROTHIAZIDE 25 MG PO TABS: 25.0000 mg | ORAL_TABLET | Freq: Every day | ORAL | 11 refills | Status: DC

## 2023-11-20 MED ORDER — SEMAGLUTIDE-WEIGHT MANAGEMENT 1 MG/0.5ML ~~LOC~~ SOAJ
1.0000 mg | SUBCUTANEOUS | 0 refills | Status: DC
Start: 2023-11-20 — End: 2024-01-22

## 2023-11-20 MED ORDER — VALSARTAN 80 MG PO TABS: 80.0000 mg | ORAL_TABLET | Freq: Every day | ORAL | 11 refills | Status: DC

## 2023-11-20 NOTE — Progress Notes (Signed)
Subjective:    Patient ID: Candice Campbell, female    DOB: [birth year], 62 y.o.   MRN: [id number]  Candice Campbell is a 62 y.o. female presenting on 11/20/2023 for Medical Management of Chronic Issues   HPI  Discussed the use of AI scribe software for clinical note transcription with the patient, who gave verbal consent to proceed.       Morbid Obesity BMI >39 Pre-Diabetes She previously was on Phentermine  Weight down 15 lbs in past 3 months. Last result A1c 6.1 down to 5.7 in 08/2023  Previously on Phentermine  She is on Semaglutide now 0.5mg  weekly inj, asking for new dose inc order Continues to improve lifestyle management diet and exercise Diet: She has improved to healthy diet avoiding carbs, high protein, and drinks water   CHRONIC HTN: Very well controlled, feels better with weight loss Current Meds - valsartan 80mg , HCTZ 25mg  daily Reports good compliance, took meds today. Tolerating well, w/o complaints.   Health Maintenance: Ordered Mammogram She will request to schedule pap     11/20/2023    8:47 AM 01/19/2022    1:59 PM 10/20/2020   10:28 AM  Depression screen PHQ 2/9  Decreased Interest 3 1 1   Down, Depressed, Hopeless 0 0 0  PHQ - 2 Score 3 1 1   Altered sleeping 0 2 2  Tired, decreased energy  1 1  Change in appetite 0 1 1  Feeling bad or failure about yourself  0 1 0  Trouble concentrating 0 1 0  Moving slowly or fidgety/restless 0 0 0  Suicidal thoughts 0 0 0  PHQ-9 Score 3 7 5   Difficult doing work/chores  Somewhat difficult Not difficult at all       11/20/2023    8:47 AM 01/19/2022    1:59 PM 07/31/2019    4:27 PM 03/19/2019    9:47 AM  GAD 7 : Generalized Anxiety Score  Nervous, Anxious, on Edge 0 1 1 2   Control/stop worrying 0 2 2 2   Worry too much - different things 0 2 2 2   Trouble relaxing 0 1 1 3   Restless 0 0 0 2  Easily annoyed or irritable 0 2 2 2   Afraid - awful might happen 0 0 0 1  Total GAD 7 Score 0 8 8 14   Anxiety Difficulty  Not  difficult at all Somewhat difficult Somewhat difficult    Social History   Tobacco Use   Smoking status: Former    Current packs/day: 0.00    Average packs/day: 0.5 packs/day for 10.0 years (5.0 ttl pk-yrs)    Types: Cigarettes    Start date: 2006    Quit date: 2016    Years since quitting: 8.9   Smokeless tobacco: Never   Tobacco comments:    pt quit smoking 10/2016  Substance Use Topics   Alcohol use: Not Currently    Alcohol/week: 0.0 standard drinks of alcohol    Comment: occasional - 6x/yr   Drug use: No    Review of Systems Per HPI unless specifically indicated above     Objective:    BP 130/84   Ht 5\' 3"  (1.6 m)   Wt 223 lb (101.2 kg)   BMI 39.50 kg/m   Wt Readings from Last 3 Encounters:  11/20/23 223 lb (101.2 kg)  08/18/23 238 lb (108 kg)  02/03/23 229 lb (103.9 kg)    Physical Exam Vitals and nursing note reviewed.  Constitutional:  General: She is not in acute distress.    Appearance: She is well-developed. She is obese. She is not diaphoretic.     Comments: Well-appearing, comfortable, cooperative  HENT:     Head: Normocephalic and atraumatic.  Eyes:     General:        Right eye: No discharge.        Left eye: No discharge.     Conjunctiva/sclera: Conjunctivae normal.  Neck:     Thyroid: No thyromegaly.  Cardiovascular:     Rate and Rhythm: Normal rate and regular rhythm.     Heart sounds: Normal heart sounds. No murmur heard. Pulmonary:     Effort: Pulmonary effort is normal. No respiratory distress.     Breath sounds: Normal breath sounds. No wheezing or rales.  Musculoskeletal:        General: Normal range of motion.     Cervical back: Normal range of motion and neck supple.  Lymphadenopathy:     Cervical: No cervical adenopathy.  Skin:    General: Skin is warm and dry.     Findings: No erythema or rash.  Neurological:     Mental Status: She is alert and oriented to person, place, and time.  Psychiatric:        Behavior:  Behavior normal.     Comments: Well groomed, good eye contact, normal speech and thoughts     Results for orders placed or performed in visit on 08/18/23  POCT glycosylated hemoglobin (Hb A1C)  Result Value Ref Range   Hemoglobin A1C 5.7 (A) 4.0 - 5.6 %   HbA1c POC (<> result, manual entry)     HbA1c, POC (prediabetic range)     HbA1c, POC (controlled diabetic range)        Assessment & Plan:   Problem List Items Addressed This Visit     Essential hypertension   Relevant Medications   hydrochlorothiazide (HYDRODIURIL) 25 MG tablet   valsartan (DIOVAN) 80 MG tablet   Morbid obesity (HCC) - Primary   Relevant Medications   Semaglutide-Weight Management 1 MG/0.5ML SOAJ   Pre-diabetes   Other Visit Diagnoses     Encounter for screening mammogram for malignant neoplasm of breast       Relevant Orders   MM 3D SCREENING MAMMOGRAM BILATERAL BREAST         Weight Management Successful weight loss of 15 pounds over the past 3 months with dietary changes, physical activity, and medication. Patient reports acclimation to current dose of semaglutide 0.5mg . -Increase semaglutide dose to 1mg  weekly. Compounded rx sent to [facility] -Continue current lifestyle modifications.  Hypertension Controlled Continue hydrochlorothiazide 25mg  daily, Valsartan 80mg  daily  Preventive Health -Order mammogram -Schedule Pap smear, last performed in 2019. -Schedule full blood panel for annual evaluation.         Orders Placed This Encounter  Procedures   MM 3D SCREENING MAMMOGRAM BILATERAL BREAST    Standing Status:   Future    Standing Expiration Date:   11/19/2024    Order Specific Question:   Reason for Exam (SYMPTOM  OR DIAGNOSIS REQUIRED)    Answer:   Screening bilateral 3D Mammogram Tomo    Order Specific Question:   Preferred imaging location?    Answer:   [facility]    Meds ordered this encounter  Medications   Semaglutide-Weight Management 1 MG/0.5ML SOAJ     Sig: Inject 1 mg into the skin once a week.    Dispense:  2 mL  Refill:  0   hydrochlorothiazide (HYDRODIURIL) 25 MG tablet    Sig: Take 1 tablet (25 mg total) by mouth daily.    Dispense:  30 tablet    Refill:  11   valsartan (DIOVAN) 80 MG tablet    Sig: Take 1 tablet (80 mg total) by mouth daily.    Dispense:  30 tablet    Refill:  11    Follow up plan: Return in about 6 weeks (around 01/01/2024) for 6 weeks fasting lab only then 1 week later Annual Physical + Need a 3rd apt w/ [name] for pap.  Future labs ordered for 01/01/24   Saralyn [name], DO [facility] 11/20/2023, 8:11 AM

## 2023-11-20 NOTE — Patient Instructions (Addendum)
Thank you for coming to the office today.  Dose increase Semagtutide from 0.5 up to 1.0 mg weekly, new order faxed to [facility]  Other BP medications renewed to the pharmacy locally 30 day supply with plenty of refills.  DUE for FASTING BLOOD WORK (no food or drink after midnight before the lab appointment, only water or coffee without cream/sugar on the morning of)  SCHEDULE "Lab Only" visit in the morning at the clinic for lab draw in 6 weeks  - Make sure Lab Only appointment is at about 1 week before your next appointment, so that results will be available  For Lab Results, once available within 2-3 days of blood draw, you can can log in to MyChart online to view your results and a brief explanation. Also, we can discuss results at next follow-up visit.   Please schedule a Follow-up Appointment to: Return in about 6 weeks (around 01/01/2024) for 6 weeks fasting lab only then 1 week later Annual Physical + Need a 3rd apt w/ [name] for pap.  If you have any other questions or concerns, please feel free to call the office or send a message through MyChart. You may also schedule an earlier appointment if necessary.  Additionally, you may be receiving a survey about your experience at our office within a few days to 1 week by e-mail or mail. We value your feedback.  Candice [name], DO [facility], [place]

## 2024-01-01 ENCOUNTER — Other Ambulatory Visit: Payer: 59

## 2024-01-08 ENCOUNTER — Ambulatory Visit: Payer: 59 | Admitting: Family Medicine

## 2024-01-15 ENCOUNTER — Ambulatory Visit (INDEPENDENT_AMBULATORY_CARE_PROVIDER_SITE_OTHER): Payer: 59 | Admitting: Internal Medicine

## 2024-01-15 ENCOUNTER — Other Ambulatory Visit: Payer: Self-pay

## 2024-01-15 ENCOUNTER — Other Ambulatory Visit (HOSPITAL_COMMUNITY)
Admission: RE | Admit: 2024-01-15 | Discharge: 2024-01-15 | Disposition: A | Discharge status: Home / Self Care | Payer: 59 | Source: Ambulatory Visit | Attending: Internal Medicine | Admitting: Internal Medicine

## 2024-01-15 ENCOUNTER — Encounter: Payer: Self-pay | Admitting: Internal Medicine

## 2024-01-15 VITALS — BP 124/84 | Ht 63.0 in | Wt 224.0 lb

## 2024-01-15 DIAGNOSIS — Z0000 Encounter for general adult medical examination without abnormal findings: Secondary | ICD-10-CM | POA: Diagnosis not present

## 2024-01-15 DIAGNOSIS — Z01419 Encounter for gynecological examination (general) (routine) without abnormal findings: Secondary | ICD-10-CM | POA: Diagnosis not present

## 2024-01-15 DIAGNOSIS — Z124 Encounter for screening for malignant neoplasm of cervix: Secondary | ICD-10-CM | POA: Insufficient documentation

## 2024-01-15 DIAGNOSIS — E7800 Pure hypercholesterolemia, unspecified: Secondary | ICD-10-CM | POA: Diagnosis not present

## 2024-01-15 DIAGNOSIS — I10 Essential (primary) hypertension: Secondary | ICD-10-CM

## 2024-01-15 DIAGNOSIS — Z1231 Encounter for screening mammogram for malignant neoplasm of breast: Secondary | ICD-10-CM

## 2024-01-15 DIAGNOSIS — R7303 Prediabetes: Secondary | ICD-10-CM | POA: Diagnosis not present

## 2024-01-15 NOTE — Patient Instructions (Signed)
Pap Test Why am I having this test? A Pap test, also called a Pap smear, is a screening test to check for signs of: Infection. Cancer of the cervix. The cervix is the lower part of the uterus that opens into the vagina. Changes that may be a sign that cancer is developing (precancerous changes). Women need this test on a regular basis. In general, you should have a Pap test every 3 years until you reach menopause or age 62. Women aged 30-60 may choose to have their Pap test done at the same time as an HPV (human papillomavirus) test every 5 years (instead of every 3 years). Your health care provider may recommend having Pap tests more or less often depending on your medical conditions and past Pap test results. What is being tested? Cervical cells are tested for signs of infection or abnormalities. What kind of sample is taken?  Your health care provider will collect a sample of cells from the surface of your cervix. This will be done using a small cotton swab, plastic spatula, or brush that is inserted into your vagina using a tool called a speculum. This sample is often collected during a pelvic exam, when you are lying on your back on an exam table with your feet in footrests (stirrups). In some cases, fluids (secretions) from the cervix or vagina may also be collected. How do I prepare for this test? Be aware of where you are in your menstrual cycle. If you are menstruating on the day of the test, you may be asked to reschedule. You may need to reschedule if you have a known vaginal infection on the day of the test. Follow instructions from your health care provider about: Changing or stopping your regular medicines. Some medicines can cause abnormal test results, such as vaginal medicines and tetracycline. Avoiding douching 2-3 days before or the day of the test. Tell a health care provider about: Any allergies you have. All medicines you are taking, including vitamins, herbs, eye drops,  creams, and over-the-counter medicines. Any bleeding problems you have. Any surgeries you have had. Any medical conditions you have. Whether you are pregnant or may be pregnant. How are the results reported? Your test results will be reported as either abnormal or normal. What do the results mean? A normal test result means that you do not have signs of cancer of the cervix. An abnormal result may mean that you have: Cancer. A Pap test by itself is not enough to diagnose cancer. You will have more tests done if cancer is suspected. Precancerous changes in your cervix. Inflammation of the cervix. An STI (sexually transmitted infection). A fungal infection. A parasite infection. Talk with your health care provider about what your results mean. In some cases, your health care provider may do more testing to confirm the results. Questions to ask your health care provider Ask your health care provider, or the department that is doing the test: When will my results be ready? How will I get my results? What are my treatment options? What other tests do I need? What are my next steps? Summary In general, women should have a Pap test every 3 years until they reach menopause or age 62. Your health care provider will collect a sample of cells from the surface of your cervix. This will be done using a small cotton swab, plastic spatula, or brush. In some cases, fluids (secretions) from the cervix or vagina may also be collected. This information is not   intended to replace advice given to you by your health care provider. Make sure you discuss any questions you have with your health care provider. Document Revised: 02/26/2021 Document Reviewed: 02/26/2021 Elsevier Patient Education  2024 Elsevier Inc.  
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
## 2024-01-16 ENCOUNTER — Other Ambulatory Visit: Payer: 59

## 2024-01-16 LAB — HEMOGLOBIN A1C
Hgb A1c MFr Bld: 5.9 %{Hb} — ABNORMAL HIGH (ref <5.7)
Mean Plasma Glucose: 123 mg/dL
eAG (mmol/L): 6.8 mmol/L

## 2024-01-16 LAB — CBC WITH DIFFERENTIAL/PLATELET
Absolute Lymphocytes: 2212 {cells}/uL (ref 850–3900)
Absolute Monocytes: 378 {cells}/uL (ref 200–950)
Basophils Absolute: 63 {cells}/uL (ref 0–200)
Basophils Relative: 0.9 %
Eosinophils Absolute: 252 {cells}/uL (ref 15–500)
Eosinophils Relative: 3.6 %
HCT: 36.4 % (ref 35.0–45.0)
Hemoglobin: 11.7 g/dL (ref 11.7–15.5)
MCH: 29.2 pg (ref 27.0–33.0)
MCHC: 32.1 g/dL (ref 32.0–36.0)
MCV: 90.8 fL (ref 80.0–100.0)
MPV: 11 fL (ref 7.5–12.5)
Monocytes Relative: 5.4 %
Neutro Abs: 4095 {cells}/uL (ref 1500–7800)
Neutrophils Relative %: 58.5 %
Platelets: 327 10*3/uL (ref 140–400)
RBC: 4.01 10*6/uL (ref 3.80–5.10)
RDW: 14.1 % (ref 11.0–15.0)
Total Lymphocyte: 31.6 %
WBC: 7 10*3/uL (ref 3.8–10.8)

## 2024-01-16 LAB — COMPLETE METABOLIC PANEL WITH GFR
AG Ratio: 1.2 (calc) (ref 1.0–2.5)
ALT: 10 U/L (ref 6–29)
AST: 16 U/L (ref 10–35)
Albumin: 3.7 g/dL (ref 3.6–5.1)
Alkaline phosphatase (APISO): 44 U/L (ref 37–153)
BUN: 14 mg/dL (ref 7–25)
CO2: 28 mmol/L (ref 20–32)
Calcium: 9.2 mg/dL (ref 8.6–10.4)
Chloride: 107 mmol/L (ref 98–110)
Creat: 0.84 mg/dL (ref 0.50–1.05)
Globulin: 3 g/dL (ref 1.9–3.7)
Glucose, Bld: 86 mg/dL (ref 65–99)
Potassium: 4 mmol/L (ref 3.5–5.3)
Sodium: 142 mmol/L (ref 135–146)
Total Bilirubin: 0.4 mg/dL (ref 0.2–1.2)
Total Protein: 6.7 g/dL (ref 6.1–8.1)
eGFR: 79 mL/min/{1.73_m2} (ref >=60)

## 2024-01-16 LAB — LIPID PANEL
Cholesterol: 171 mg/dL (ref <200)
HDL: 57 mg/dL (ref >=50)
LDL Cholesterol (Calc): 99 mg/dL
Non-HDL Cholesterol (Calc): 114 mg/dL (ref <130)
Total CHOL/HDL Ratio: 3 (calc) (ref <5.0)
Triglycerides: 67 mg/dL (ref <150)

## 2024-01-16 LAB — TSH: TSH: 1.43 m[IU]/L (ref 0.40–4.50)

## 2024-01-17 LAB — CYTOLOGY - PAP
Adequacy: ABSENT
Comment: NEGATIVE
Diagnosis: NEGATIVE
High risk HPV: NEGATIVE

## 2024-01-18 ENCOUNTER — Encounter: Payer: Self-pay | Admitting: Internal Medicine

## 2024-01-22 ENCOUNTER — Encounter: Payer: Self-pay | Admitting: Family Medicine

## 2024-01-22 ENCOUNTER — Ambulatory Visit (INDEPENDENT_AMBULATORY_CARE_PROVIDER_SITE_OTHER): Payer: 59 | Admitting: Family Medicine

## 2024-01-22 VITALS — BP 130/74 | HR 77 | Ht 63.0 in | Wt 224.0 lb

## 2024-01-22 DIAGNOSIS — E7800 Pure hypercholesterolemia, unspecified: Secondary | ICD-10-CM | POA: Diagnosis not present

## 2024-01-22 DIAGNOSIS — I10 Essential (primary) hypertension: Secondary | ICD-10-CM | POA: Diagnosis not present

## 2024-01-22 DIAGNOSIS — R7303 Prediabetes: Secondary | ICD-10-CM | POA: Diagnosis not present

## 2024-01-22 DIAGNOSIS — Z0000 Encounter for general adult medical examination without abnormal findings: Secondary | ICD-10-CM

## 2024-01-22 MED ORDER — CONTRAVE 8-90 MG PO TB12: ORAL_TABLET | ORAL | 0 refills | Status: DC

## 2024-01-22 NOTE — Patient Instructions (Addendum)
 Thank you for coming to the office today.   For Mammogram screening for breast cancer   Call the Imaging Center below anytime to schedule your own appointment now that order has been placed.  [facility] [address], [address] [place], [place] [address] Phone: [phone]  ------------------------------------------------------  Try 1 week sample Contrave  - Week 1: 1 pill daily with meal - Week 2: 1 pill twice a day with meal - Week 3: 2 pill with meal in AM and 1 pill with PM meal - Week 4: 2 pill twice a day with meal   Please notify me after 1 week, and if doing well on Contrave , I can order to [facility] pharmacy $99 per month plan with this med.   Please schedule a Follow-up Appointment to: Return in about 3 months (around 04/20/2024) for 3 month follow-up HTN, Weight Check.  If you have any other questions or concerns, please feel free to call the office or send a message through MyChart. You may also schedule an earlier appointment if necessary.  Additionally, you may be receiving a survey about your experience at our office within a few days to 1 week by e-mail or mail. We value your feedback.  Domingo Friend, DO [facility] [place], [place]
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
## 2024-03-07 ENCOUNTER — Telehealth: Payer: Self-pay

## 2024-03-07 MED ORDER — SEMAGLUTIDE-WEIGHT MANAGEMENT 2.4 MG/0.75ML ~~LOC~~ SOAJ: 2.4000 mg | SUBCUTANEOUS | 5 refills | Status: DC

## 2024-03-07 NOTE — Telephone Encounter (Signed)
 Copied from CRM [id number]. Topic: Clinical - Prescription Issue >> Mar 07, 2024 12:46 PM Dondra Prader E wrote: Reason for CRM: Pt called reporting that she does not like the pill version of the weight loss medication tirzepatide she wants to resume injections.

## 2024-03-07 NOTE — Telephone Encounter (Signed)
 I called patient. She cannot tolerate Contrave, so I have discontinued it on her list.  In past she was on Semaglutide 1mg  weekly at [facility], last dose 3 weeks ago. She would like to restart injections at higher dose.  I called [facility] sent verbal rx for Semaglutide 2.4mg  max dose weekly for 1 month + 5 refills. Note they will stop production of this as compounded med 05/02/24.  Saralyn [name], DO [facility] 03/07/2024, 2:36 PM

## 2024-03-07 NOTE — Addendum Note (Signed)
 Addended by: Smitty Cords on: 03/07/2024 02:36 PM   Modules accepted: Orders

## 2024-04-15 ENCOUNTER — Ambulatory Visit: Payer: 59 | Admitting: Family Medicine

## 2024-06-03 ENCOUNTER — Encounter

## 2024-07-12 ENCOUNTER — Telehealth: Payer: Self-pay

## 2024-07-12 NOTE — Telephone Encounter (Signed)
 Copied from CRM [id number]. Topic: Clinical - Medication Question >> Jul 12, 2024  1:31 PM Nathanel BROCKS wrote: Reason for CRM: please call pt about semiglutide her pharmacy has gone up to 549.00. she is looking for another pharmacy and needs your assistance

## 2024-07-17 MED ORDER — TIRZEPATIDE-WEIGHT MANAGEMENT 7.5 MG/0.5ML ~~LOC~~ SOLN: 7.5000 mg | SUBCUTANEOUS | 0 refills | Status: DC

## 2024-07-17 NOTE — Addendum Note (Signed)
 Addended by: EDMAN MARSA PARAS on: 07/17/2024 10:56 AM   Modules accepted: Orders

## 2024-07-17 NOTE — Telephone Encounter (Signed)
 Called patient. She understands limitation on these meds. She suggested other pharmacy she heard about [facility]. I called them and they do have both Semaglutide  and Tirzepatide  pre filled syringes compounded. They have limited semaglutide  available and not able to get more. I phoned in Tirzepatide  7.5mg  weekly dose for 1 month supply for her and they will set it up, she can contact us  for re order dose at 10mg  next time. Again it is limited supply they can no longer produce more of them in future due to legal issues. But they will use what they have left.  Marsa Officer, DO [facility] 07/17/2024, 10:56 AM

## 2024-07-29 ENCOUNTER — Encounter: Payer: Self-pay | Admitting: Family Medicine

## 2024-07-29 ENCOUNTER — Ambulatory Visit (INDEPENDENT_AMBULATORY_CARE_PROVIDER_SITE_OTHER): Admitting: Family Medicine

## 2024-07-29 DIAGNOSIS — I10 Essential (primary) hypertension: Secondary | ICD-10-CM | POA: Diagnosis not present

## 2024-07-29 DIAGNOSIS — E7800 Pure hypercholesterolemia, unspecified: Secondary | ICD-10-CM | POA: Diagnosis not present

## 2024-07-29 MED ORDER — TIRZEPATIDE 10 MG/0.5ML ~~LOC~~ SOAJ: 10.0000 mg | SUBCUTANEOUS | 0 refills | Status: DC

## 2024-07-29 NOTE — Progress Notes (Signed)
 Subjective:    Patient ID: Candice Campbell, female    DOB: [birth year], 63 y.o.   MRN: [id number]  Candice Campbell is a 63 y.o. female presenting on 07/29/2024 for Medical Management of Chronic Issues and Obesity   HPI  Discussed the use of AI scribe software for clinical note transcription with the patient, who gave verbal consent to proceed.  History of Present Illness   Candice Campbell is a 63 year old female who presents for a follow-up visit to discuss medication management and weight loss.  Morbid Obesity Weight management and pharmacotherapy - Currently taking compounded tirzepatide  for weight loss - Weight decreased from 224 lbs to 217 lbs since last visit - Goal weight is less than 200 lbs - Exercises three days per week following regimen. She is improving guided diet regimen as well. - Motivated to continue weight loss regimen - Three weeks remaining on current tirzepatide  prescription  Current rx [facility] Compounded Tirzepatide  7.5mg  weekly She will need phone in dose adjustment to next dose now  Hypertension No new concerns. Controlled on current therapy hydrochlorothiazide  25mg  daily Valsartan  80mg   HM Due Mammogram, needs to schedule     11/20/2023    8:47 AM 01/19/2022    1:59 PM 10/20/2020   10:28 AM  Depression screen PHQ 2/9  Decreased Interest 3 1 1   Down, Depressed, Hopeless 0 0 0  PHQ - 2 Score 3 1 1   Altered sleeping 0 2 2  Tired, decreased energy  1 1  Change in appetite 0 1 1  Feeling bad or failure about yourself  0 1 0  Trouble concentrating 0 1 0  Moving slowly or fidgety/restless 0 0 0  Suicidal thoughts 0 0 0  PHQ-9 Score 3 7 5   Difficult doing work/chores  Somewhat difficult Not difficult at all       11/20/2023    8:47 AM 01/19/2022    1:59 PM 07/31/2019    4:27 PM 03/19/2019    9:47 AM  GAD 7 : Generalized Anxiety Score  Nervous, Anxious, on Edge 0 1 1 2   Control/stop worrying 0 2 2 2   Worry too much - different  things 0 2 2 2   Trouble relaxing 0 1 1 3   Restless 0 0 0 2  Easily annoyed or irritable 0 2 2 2   Afraid - awful might happen 0 0 0 1  Total GAD 7 Score 0 8 8 14   Anxiety Difficulty  Not difficult at all Somewhat difficult Somewhat difficult    Social History   Tobacco Use   Smoking status: Former    Current packs/day: 0.00    Average packs/day: 0.5 packs/day for 10.0 years (5.0 ttl pk-yrs)    Types: Cigarettes    Start date: 2006    Quit date: 2016    Years since quitting: 9.6   Smokeless tobacco: Never   Tobacco comments:    pt quit smoking 10/2016  Substance Use Topics   Alcohol use: Not Currently    Alcohol/week: 0.0 standard drinks of alcohol    Comment: occasional - 6x/yr   Drug use: No    Review of Systems Per HPI unless specifically indicated above     Objective:    BP 112/78 (BP Location: Right Arm, Patient Position: Sitting, Cuff Size: Normal)   Pulse 77   Ht 5' 3 (1.6 m)   Wt 217 lb (98.4 kg)   SpO2 97%   BMI 38.44 kg/m   Wt  Readings from Last 3 Encounters:  07/29/24 217 lb (98.4 kg)  01/22/24 224 lb (101.6 kg)  01/15/24 224 lb (101.6 kg)    Physical Exam Vitals and nursing note reviewed.  Constitutional:      General: She is not in acute distress.    Appearance: She is well-developed. She is obese. She is not diaphoretic.     Comments: Well-appearing, comfortable, cooperative  HENT:     Head: Normocephalic and atraumatic.  Eyes:     General:        Right eye: No discharge.        Left eye: No discharge.     Conjunctiva/sclera: Conjunctivae normal.  Neck:     Thyroid: No thyromegaly.  Cardiovascular:     Rate and Rhythm: Normal rate and regular rhythm.     Heart sounds: Normal heart sounds. No murmur heard. Pulmonary:     Effort: Pulmonary effort is normal. No respiratory distress.     Breath sounds: Normal breath sounds. No wheezing or rales.  Musculoskeletal:        General: Normal range of motion.     Cervical back: Normal range of  motion and neck supple.  Lymphadenopathy:     Cervical: No cervical adenopathy.  Skin:    General: Skin is warm and dry.     Findings: No erythema or rash.  Neurological:     Mental Status: She is alert and oriented to person, place, and time.  Psychiatric:        Behavior: Behavior normal.     Comments: Well groomed, good eye contact, normal speech and thoughts     Results for orders placed or performed in visit on 01/15/24  TSH   Collection Time: 01/15/24  8:13 AM  Result Value Ref Range   TSH 1.43 0.40 - 4.50 mIU/L  CBC with Differential/Platelet   Collection Time: 01/15/24  8:13 AM  Result Value Ref Range   WBC 7.0 3.8 - 10.8 Thousand/uL   RBC 4.01 3.80 - 5.10 Million/uL   Hemoglobin 11.7 11.7 - 15.5 g/dL   HCT 63.5 64.9 - 54.9 %   MCV 90.8 80.0 - 100.0 fL   MCH 29.2 27.0 - 33.0 pg   MCHC 32.1 32.0 - 36.0 g/dL   RDW 85.8 88.9 - 84.9 %   Platelets 327 140 - 400 Thousand/uL   MPV 11.0 7.5 - 12.5 fL   Neutro Abs 4,095 1,500 - 7,800 cells/uL   Absolute Lymphocytes 2,212 850 - 3,900 cells/uL   Absolute Monocytes 378 200 - 950 cells/uL   Eosinophils Absolute 252 15 - 500 cells/uL   Basophils Absolute 63 0 - 200 cells/uL   Neutrophils Relative % 58.5 %   Total Lymphocyte 31.6 %   Monocytes Relative 5.4 %   Eosinophils Relative 3.6 %   Basophils Relative 0.9 %  COMPLETE METABOLIC PANEL WITH GFR   Collection Time: 01/15/24  8:13 AM  Result Value Ref Range   Glucose, Bld 86 65 - 99 mg/dL   BUN 14 7 - 25 mg/dL   Creat 9.15 9.49 - 8.94 mg/dL   eGFR 79 > OR = 60 fO/fpw/8.26f7   BUN/Creatinine Ratio SEE NOTE: 6 - 22 (calc)   Sodium 142 135 - 146 mmol/L   Potassium 4.0 3.5 - 5.3 mmol/L   Chloride 107 98 - 110 mmol/L   CO2 28 20 - 32 mmol/L   Calcium 9.2 8.6 - 10.4 mg/dL   Total Protein 6.7 6.1 - 8.1 g/dL  Albumin 3.7 3.6 - 5.1 g/dL   Globulin 3.0 1.9 - 3.7 g/dL (calc)   AG Ratio 1.2 1.0 - 2.5 (calc)   Total Bilirubin 0.4 0.2 - 1.2 mg/dL   Alkaline phosphatase (APISO)  44 37 - 153 U/L   AST 16 10 - 35 U/L   ALT 10 6 - 29 U/L  Lipid panel   Collection Time: 01/15/24  8:13 AM  Result Value Ref Range   Cholesterol 171 <200 mg/dL   HDL 57 > OR = 50 mg/dL   Triglycerides 67 <849 mg/dL   LDL Cholesterol (Calc) 99 mg/dL (calc)   Total CHOL/HDL Ratio 3.0 <5.0 (calc)   Non-HDL Cholesterol (Calc) 114 <130 mg/dL (calc)  Hemoglobin J8r   Collection Time: 01/15/24  8:13 AM  Result Value Ref Range   Hgb A1c MFr Bld 5.9 (H) <5.7 % of total Hgb   Mean Plasma Glucose 123 mg/dL   eAG (mmol/L) 6.8 mmol/L      Assessment & Plan:   Problem List Items Addressed This Visit     Essential hypertension   Morbid obesity (HCC) - Primary   Relevant Medications   tirzepatide  (MOUNJARO ) 10 MG/0.5ML Pen   Pure hypercholesterolemia     Morbid Obesity Weight decreased from 224 lbs to 217 lbs. She is motivated to continue weight loss and open to increasing tirzepatide  dosage. - Continues physician guided diet and exercise regimen as tolerated - Increase tirzepatide  dosage from 7.5 mg to 10 mg. - Call [facility] to update prescription for tirzepatide  to 10 mg for one month. - Instruct her to contact via MyChart after one month to discuss maintaining or increasing the dosage to 12.5 mg.  Hypertension Hypertension well-controlled with current blood pressure of 112/78 mmHg. - Continue current antihypertensive medication regimen hydrochlorothiazide  25mg  daily and valsartan  80mg  daily  General Health Maintenance Discussed importance of vaccinations and screenings. Recommended pneumonia vaccine for those 50 and older. - Recommend flu shot in the fall. - Discussed Prevnar 20 pneumonia vaccine, available at pharmacy. - Encourage scheduling a mammogram.        No orders of the defined types were placed in this encounter.   Meds ordered this encounter  Medications   tirzepatide  (MOUNJARO ) 10 MG/0.5ML Pen    Sig: Inject 10 mg into the skin once a week.     Dispense:  2 mL    Refill:  0    Dose increase from 7.5 up to 10mg     Follow up plan: Return for 6 month fasting lab > 1 week later Annual Physical.    Marsa Officer, DO [facility] 07/29/2024, 3:09 PM

## 2024-07-29 NOTE — Patient Instructions (Addendum)
 Thank you for coming to the office today.  We will increase dose Tirzepatide  from 7.5 up to 10mg   New dose will be called in to [facility] pharmacy for 1 month  Contact me back on MyChart if you want to keep the 10 or dose up to 12.5 - let me know  BP is well controlled  Weight is improving  Prevnar-20 - Pneumonia vaccine whenever you are ready.  For Mammogram screening for breast cancer   Call the Imaging Center below anytime to schedule your own appointment now that order has been placed.  [facility] at [facility] [address] [place], [place] [address] Phone: [phone]  DUE for FASTING BLOOD WORK (no food or drink after midnight before the lab appointment, only water  or coffee without cream/sugar on the morning of)  SCHEDULE Lab Only visit in the morning at the clinic for lab draw in 6 MONTHS   - Make sure Lab Only appointment is at about 1 week before your next appointment, so that results will be available  For Lab Results, once available within 2-3 days of blood draw, you can can log in to MyChart online to view your results and a brief explanation. Also, we can discuss results at next follow-up visit.   Please schedule a Follow-up Appointment to: Return for 6 month fasting lab > 1 week later Annual Physical.  If you have any other questions or concerns, please feel free to call the office or send a message through MyChart. You may also schedule an earlier appointment if necessary.  Additionally, you may be receiving a survey about your experience at our office within a few days to 1 week by e-mail or mail. We value your feedback.  Marsa Officer, DO [facility], [place]

## 2024-09-02 ENCOUNTER — Ambulatory Visit

## 2024-09-13 ENCOUNTER — Other Ambulatory Visit: Payer: Self-pay

## 2024-09-13 MED ORDER — FLUZONE 0.5 ML IM SUSY
0.5000 mL | PREFILLED_SYRINGE | Freq: Once | INTRAMUSCULAR | 0 refills | Status: AC
  Filled 2024-09-13: qty 0.5, 1d supply, fill #0

## 2024-10-04 ENCOUNTER — Telehealth: Payer: Self-pay | Admitting: Family Medicine

## 2024-10-07 NOTE — Telephone Encounter (Signed)
 Patient notified this has been called in to pharmacy

## 2024-10-07 NOTE — Telephone Encounter (Signed)
 Requested medication (s) are due for refill today: yes  Requested medication (s) are on the active medication list: yes  Last refill:  07/29/24  Future visit scheduled: yes  Notes to clinic:   Medication not assigned to a protocol, review manually.      Requested Prescriptions  Pending Prescriptions Disp Refills   tirzepatide  (MOUNJARO ) 10 MG/0.5ML Pen [Pharmacy Med Name: tirzepatide  10 mg/0.2 mL subcutaneous syringe] 4 mL 0    Sig: INJECT ONE SYRINGE (10MG ) under the skin once weekly (every 7 days)     Off-Protocol Failed - 10/07/2024 11:26 AM      Failed - Medication not assigned to a protocol, review manually.      Passed - Valid encounter within last 12 months    Recent Outpatient Visits           2 months ago Morbid obesity [facility])   [place] [facility] [place], Marsa PARAS, DO   8 months ago Annual physical exam   [place] [facility] [place] Marsa PARAS, DO   8 months ago Encounter for gynecological examination without abnormal finding   [facility] [place], Angeline ORN, [place]

## 2024-10-07 NOTE — Telephone Encounter (Signed)
 Please let her know that I called the pharmacy [facility] location) and sent in verbal refills for the Tirzepatide  compounded 10mg  dose for 1 month +2 refills.  This is not going through insurance, and she is paying out of pocket, so nothing else is required at this time.   They do have limited availability on this in the future depending on the dosage, just for future reference.  Marsa Officer, DO [facility] 10/07/2024, 1:27 PM

## 2024-11-01 ENCOUNTER — Ambulatory Visit (INDEPENDENT_AMBULATORY_CARE_PROVIDER_SITE_OTHER)

## 2024-11-01 VITALS — BP 142/90 | HR 71 | Ht 63.0 in | Wt 220.0 lb

## 2024-11-01 DIAGNOSIS — K4090 Unilateral inguinal hernia, without obstruction or gangrene, not specified as recurrent: Secondary | ICD-10-CM | POA: Diagnosis not present
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
## 2024-11-06 ENCOUNTER — Ambulatory Visit

## 2024-11-11 ENCOUNTER — Ambulatory Visit

## 2024-12-14 ENCOUNTER — Other Ambulatory Visit: Payer: Self-pay | Admitting: Family Medicine

## 2024-12-14 DIAGNOSIS — I10 Essential (primary) hypertension: Secondary | ICD-10-CM

## 2024-12-17 NOTE — Telephone Encounter (Signed)
 Requested medications are due for refill today.  yes  Requested medications are on the active medications list.  yes  Last refill. 11/20/2023 #30 11rf for both  Future visit scheduled.   yes  Notes to clinic.  Labs are expired.    Requested Prescriptions  Pending Prescriptions Disp Refills   hydrochlorothiazide  (HYDRODIURIL ) 25 MG tablet [Pharmacy Med Name: hydroCHLOROthiazide  25 MG Oral Tablet] 30 tablet 0    Sig: Take 1 tablet by mouth once daily     Cardiovascular: Diuretics - Thiazide Failed - 12/17/2024  7:48 AM      Failed - Cr in normal range and within 180 days    Creat  Date Value Ref Range Status  01/15/2024 0.84 0.50 - 1.05 mg/dL Final         Failed - K in normal range and within 180 days    Potassium  Date Value Ref Range Status  01/15/2024 4.0 3.5 - 5.3 mmol/L Final         Failed - Na in normal range and within 180 days    Sodium  Date Value Ref Range Status  01/15/2024 142 135 - 146 mmol/L Final         Failed - Last BP in normal range    BP Readings from Last 1 Encounters:  11/01/24 (!) 142/90         Passed - Valid encounter within last 6 months    Recent Outpatient Visits           1 month ago Unilateral inguinal hernia without obstruction or gangrene, recurrence not specified   [facility], Parris LABOR, MD   4 months ago Morbid obesity [facility])   [place] [facility] Marsa PARAS, DO   11 months ago Annual physical exam   [place] [facility] Marsa PARAS, DO   11 months ago Encounter for gynecological examination without abnormal finding   [place] [facility] [place], Angeline ORN, NP               valsartan  (DIOVAN ) 80 MG tablet [Pharmacy Med Name: Valsartan  80 MG Oral Tablet] 30 tablet 0    Sig: Take 1 tablet by mouth once daily     Cardiovascular:  Angiotensin Receptor Blockers Failed - 12/17/2024  7:48 AM      Failed - Cr in  normal range and within 180 days    Creat  Date Value Ref Range Status  01/15/2024 0.84 0.50 - 1.05 mg/dL Final         Failed - K in normal range and within 180 days    Potassium  Date Value Ref Range Status  01/15/2024 4.0 3.5 - 5.3 mmol/L Final         Failed - Last BP in normal range    BP Readings from Last 1 Encounters:  11/01/24 (!) 142/90         Passed - Patient is not pregnant      Passed - Valid encounter within last 6 months    Recent Outpatient Visits           1 month ago Unilateral inguinal hernia without obstruction or gangrene, recurrence not specified   [place] [facility] Everlene Parris LABOR, MD   4 months ago Morbid obesity [facility])   [facility] [place] Marsa PARAS, DO   11 months ago Annual physical exam  [employer] [facility] Ed[place] Marsa PARAS, DO   11 months ago Encounter for gynecological examination without abnormal finding   [facility] [place], Angeline ORN, [place]

## 2025-01-09 ENCOUNTER — Other Ambulatory Visit: Payer: Self-pay | Admitting: Family Medicine

## 2025-01-09 DIAGNOSIS — I10 Essential (primary) hypertension: Secondary | ICD-10-CM

## 2025-01-09 DIAGNOSIS — Z0000 Encounter for general adult medical examination without abnormal findings: Secondary | ICD-10-CM

## 2025-01-09 DIAGNOSIS — R7303 Prediabetes: Secondary | ICD-10-CM

## 2025-01-09 DIAGNOSIS — E7800 Pure hypercholesterolemia, unspecified: Secondary | ICD-10-CM

## 2025-01-13 ENCOUNTER — Other Ambulatory Visit

## 2025-01-16 ENCOUNTER — Other Ambulatory Visit: Payer: Self-pay | Admitting: Family Medicine

## 2025-01-16 NOTE — Telephone Encounter (Unsigned)
 Copied from CRM [id number]. Topic: Clinical - Medication Refill >> Jan 16, 2025  2:44 PM Sophia H wrote: Medication: tirzepatide  (MOUNJARO ) 10 MG/0.5ML Pen  Has the patient contacted their pharmacy? Yes, out of refills   This is the patient's preferred pharmacy:   [facility] - [place], [place] - [address] [place] [address] [place] [address] Phone: [phone] Fax: [phone]  Is this the correct pharmacy for this prescription? Yes If no, delete pharmacy and type the correct one.   Has the prescription been filled recently? Yes  Is the patient out of the medication? Yes  Has the patient been seen for an appointment in the last year OR does the patient have an upcoming appointment? Yes, had to reschedule physical to April - needing enough fills to get to appt   Can we respond through MyChart? Yes  Agent: Please be advised that Rx refills may take up to 3 business days. We ask that you follow-up with your pharmacy.

## 2025-01-17 ENCOUNTER — Telehealth: Payer: Self-pay

## 2025-01-17 NOTE — Telephone Encounter (Signed)
 She has not been on actual brand name Mounjaro . She is not a Diabetic. We were using the compounding pharmacy to order Tirzepatide .  This is likely no longer available. I am not sure if [facility] offers it either.  Would you be able to call [facility] to check if they still have any compounded Tirzepatide  and find out which doses available?  She would need to consider out of pocket cost on oral Wegovy  $149 to 199 a month as next option or purchasing Zepbound  from [facility] $300+ per month  Marsa Officer, DO [facility] 01/17/2025, 9:58 AM

## 2025-01-17 NOTE — Telephone Encounter (Signed)
 Copied from CRM [id number]. Topic: Clinical - Prescription Issue >> Jan 16, 2025  4:04 PM Nathanel BROCKS wrote: Reason for CRM: tirzepatide  (MOUNJARO ) 10 MG/0.5ML Pen   Pt stated that the pharmacy stated that they are not making this medication anymore and needs something different prescribed. She asked if we have any samples. Please call pt.

## 2025-01-17 NOTE — Telephone Encounter (Signed)
 Order form completed for rx to be faxed to [facility] for Semaglutide  2.4mg  weekly inj order. She should be contacted by the pharmacy next. It is in my box and will fax out on Monday 2/9  Marsa Officer, DO [facility] 01/17/2025, 5:26 PM

## 2025-01-17 NOTE — Telephone Encounter (Signed)
 Requested medication (s) are due for refill today: na   Requested medication (s) are on the active medication list: yes   Last refill:  10/07/24 #4 ml 0 refills  Future visit scheduled: yes 03/12/25  Notes to clinic:  medication not assigned to a protocol. Do you want to refill Rx?     Requested Prescriptions  Pending Prescriptions Disp Refills   tirzepatide  (MOUNJARO ) 10 MG/0.5ML Pen 4 mL 0     Off-Protocol Failed - 01/17/2025  3:39 PM      Failed - Medication not assigned to a protocol, review manually.      Passed - Valid encounter within last 12 months    Recent Outpatient Visits           2 months ago Unilateral inguinal hernia without obstruction or gangrene, recurrence not specified   [place] [facility] Everlene Parris LABOR, MD   5 months ago Morbid obesity [facility])   [facility] Marsa PARAS, DO   12 months ago Annual physical exam   [place] [facility] Ed[place] Marsa PARAS, DO   1 year ago Encounter for gynecological examination without abnormal finding   [facility] [place], Angeline ORN, [place]

## 2025-01-21 ENCOUNTER — Encounter: Admitting: Family Medicine

## 2025-03-12 ENCOUNTER — Encounter: Admitting: Family Medicine

## 2025-07-28 ENCOUNTER — Other Ambulatory Visit

## 2025-08-04 ENCOUNTER — Encounter: Admitting: Family Medicine
